# Patient Record
Sex: Male | Born: 1970
Health system: Southern US, Community
[De-identification: ages and names within clinical notes are randomized; demographics above are authoritative.]

## PROBLEM LIST (undated history)

## (undated) DIAGNOSIS — K5090 Crohn's disease, unspecified, without complications: Secondary | ICD-10-CM

## (undated) HISTORY — PX: ABDOMINAL SURGERY: SHX537

## (undated) HISTORY — DX: Crohn's disease, unspecified, without complications: K50.90

---

## 2000-08-14 ENCOUNTER — Emergency Department (HOSPITAL_COMMUNITY): Admission: EM | Admit: 2000-08-14 | Discharge: 2000-08-14 | Payer: Self-pay | Admitting: Emergency Medicine

## 2001-04-08 ENCOUNTER — Emergency Department (HOSPITAL_COMMUNITY): Admission: EM | Admit: 2001-04-08 | Discharge: 2001-04-08 | Payer: Self-pay | Admitting: Emergency Medicine

## 2001-07-28 ENCOUNTER — Emergency Department (HOSPITAL_COMMUNITY): Admission: EM | Admit: 2001-07-28 | Discharge: 2001-07-29 | Payer: Self-pay | Admitting: *Deleted

## 2001-09-02 ENCOUNTER — Encounter: Payer: Self-pay | Admitting: General Surgery

## 2001-09-03 ENCOUNTER — Inpatient Hospital Stay (HOSPITAL_COMMUNITY): Admission: RE | Admit: 2001-09-03 | Discharge: 2001-09-09 | Payer: Self-pay | Admitting: General Surgery

## 2001-09-03 ENCOUNTER — Encounter (INDEPENDENT_AMBULATORY_CARE_PROVIDER_SITE_OTHER): Payer: Self-pay | Admitting: *Deleted

## 2001-09-12 ENCOUNTER — Emergency Department (HOSPITAL_COMMUNITY): Admission: EM | Admit: 2001-09-12 | Discharge: 2001-09-12 | Payer: Self-pay

## 2002-02-18 ENCOUNTER — Ambulatory Visit (HOSPITAL_COMMUNITY): Admission: RE | Admit: 2002-02-18 | Discharge: 2002-02-18 | Payer: Self-pay | Admitting: Gastroenterology

## 2002-02-18 ENCOUNTER — Encounter (INDEPENDENT_AMBULATORY_CARE_PROVIDER_SITE_OTHER): Payer: Self-pay | Admitting: *Deleted

## 2002-02-18 ENCOUNTER — Encounter: Payer: Self-pay | Admitting: Gastroenterology

## 2004-12-01 ENCOUNTER — Emergency Department (HOSPITAL_COMMUNITY): Admission: EM | Admit: 2004-12-01 | Discharge: 2004-12-02 | Payer: Self-pay | Admitting: Emergency Medicine

## 2005-03-16 ENCOUNTER — Emergency Department (HOSPITAL_COMMUNITY): Admission: EM | Admit: 2005-03-16 | Discharge: 2005-03-16 | Payer: Self-pay | Admitting: Emergency Medicine

## 2005-04-04 ENCOUNTER — Ambulatory Visit: Payer: Self-pay | Admitting: Family Medicine

## 2005-05-02 ENCOUNTER — Encounter (INDEPENDENT_AMBULATORY_CARE_PROVIDER_SITE_OTHER): Payer: Self-pay | Admitting: Specialist

## 2005-05-02 ENCOUNTER — Emergency Department (HOSPITAL_COMMUNITY): Admission: EM | Admit: 2005-05-02 | Discharge: 2005-05-02 | Payer: Self-pay | Admitting: Emergency Medicine

## 2005-06-13 ENCOUNTER — Ambulatory Visit: Payer: Self-pay | Admitting: Gastroenterology

## 2005-06-29 ENCOUNTER — Emergency Department (HOSPITAL_COMMUNITY): Admission: EM | Admit: 2005-06-29 | Discharge: 2005-06-29 | Payer: Self-pay | Admitting: Emergency Medicine

## 2005-08-15 ENCOUNTER — Encounter (INDEPENDENT_AMBULATORY_CARE_PROVIDER_SITE_OTHER): Payer: Self-pay | Admitting: *Deleted

## 2005-08-15 ENCOUNTER — Ambulatory Visit: Payer: Self-pay | Admitting: Gastroenterology

## 2005-08-15 DIAGNOSIS — K5289 Other specified noninfective gastroenteritis and colitis: Secondary | ICD-10-CM

## 2005-08-29 ENCOUNTER — Ambulatory Visit: Payer: Self-pay | Admitting: Gastroenterology

## 2005-09-01 ENCOUNTER — Ambulatory Visit: Payer: Self-pay | Admitting: Gastroenterology

## 2005-09-01 ENCOUNTER — Ambulatory Visit (HOSPITAL_COMMUNITY): Admission: RE | Admit: 2005-09-01 | Discharge: 2005-09-01 | Payer: Self-pay | Admitting: Gastroenterology

## 2005-09-10 ENCOUNTER — Ambulatory Visit: Payer: Self-pay | Admitting: Gastroenterology

## 2005-10-13 ENCOUNTER — Emergency Department (HOSPITAL_COMMUNITY): Admission: EM | Admit: 2005-10-13 | Discharge: 2005-10-13 | Payer: Self-pay | Admitting: *Deleted

## 2006-09-07 ENCOUNTER — Emergency Department (HOSPITAL_COMMUNITY): Admission: EM | Admit: 2006-09-07 | Discharge: 2006-09-07 | Payer: Self-pay | Admitting: Emergency Medicine

## 2007-09-08 ENCOUNTER — Ambulatory Visit: Payer: Self-pay | Admitting: Gastroenterology

## 2007-09-08 LAB — CONVERTED CEMR LAB
ALT: 9 units/L (ref 0–53)
Alkaline Phosphatase: 78 units/L (ref 39–117)
BUN: 8 mg/dL (ref 6–23)
Basophils Relative: 1.3 % — ABNORMAL HIGH (ref 0.0–1.0)
CO2: 30 meq/L (ref 19–32)
Calcium: 9.7 mg/dL (ref 8.4–10.5)
Creatinine, Ser: 1 mg/dL (ref 0.4–1.5)
Eosinophils Relative: 4.5 % (ref 0.0–5.0)
Glucose, Bld: 85 mg/dL (ref 70–99)
Hemoglobin: 13.6 g/dL (ref 13.0–17.0)
Monocytes Absolute: 0.6 10*3/uL (ref 0.2–0.7)
Monocytes Relative: 12.4 % — ABNORMAL HIGH (ref 3.0–11.0)
Platelets: 394 10*3/uL (ref 150–400)
RDW: 13.6 % (ref 11.5–14.6)
Total Bilirubin: 0.8 mg/dL (ref 0.3–1.2)
Total Protein: 6.1 g/dL (ref 6.0–8.3)
WBC: 4.5 10*3/uL (ref 4.5–10.5)

## 2008-01-07 ENCOUNTER — Ambulatory Visit: Payer: Self-pay | Admitting: Gastroenterology

## 2008-07-14 ENCOUNTER — Telehealth: Payer: Self-pay | Admitting: Gastroenterology

## 2008-12-23 ENCOUNTER — Emergency Department (HOSPITAL_COMMUNITY): Admission: EM | Admit: 2008-12-23 | Discharge: 2008-12-23 | Payer: Self-pay | Admitting: Emergency Medicine

## 2008-12-23 ENCOUNTER — Encounter: Payer: Self-pay | Admitting: Gastroenterology

## 2008-12-23 ENCOUNTER — Encounter (INDEPENDENT_AMBULATORY_CARE_PROVIDER_SITE_OTHER): Payer: Self-pay | Admitting: *Deleted

## 2008-12-23 ENCOUNTER — Ambulatory Visit: Payer: Self-pay | Admitting: Internal Medicine

## 2009-05-07 ENCOUNTER — Encounter: Payer: Self-pay | Admitting: Gastroenterology

## 2009-05-11 ENCOUNTER — Ambulatory Visit: Payer: Self-pay | Admitting: Gastroenterology

## 2009-06-13 ENCOUNTER — Telehealth: Payer: Self-pay | Admitting: Gastroenterology

## 2009-06-15 ENCOUNTER — Ambulatory Visit: Payer: Self-pay | Admitting: Gastroenterology

## 2009-07-10 ENCOUNTER — Ambulatory Visit: Payer: Self-pay | Admitting: Family Medicine

## 2009-07-11 ENCOUNTER — Inpatient Hospital Stay (HOSPITAL_COMMUNITY): Admission: EM | Admit: 2009-07-11 | Discharge: 2009-07-15 | Payer: Self-pay | Admitting: Emergency Medicine

## 2009-07-13 ENCOUNTER — Telehealth (INDEPENDENT_AMBULATORY_CARE_PROVIDER_SITE_OTHER): Payer: Self-pay | Admitting: *Deleted

## 2009-07-23 ENCOUNTER — Inpatient Hospital Stay (HOSPITAL_COMMUNITY): Admission: EM | Admit: 2009-07-23 | Discharge: 2009-07-26 | Payer: Self-pay | Admitting: Emergency Medicine

## 2009-08-14 ENCOUNTER — Telehealth (INDEPENDENT_AMBULATORY_CARE_PROVIDER_SITE_OTHER): Payer: Self-pay | Admitting: *Deleted

## 2010-06-20 ENCOUNTER — Encounter: Payer: Self-pay | Admitting: Gastroenterology

## 2010-08-25 ENCOUNTER — Encounter: Payer: Self-pay | Admitting: Internal Medicine

## 2010-09-03 NOTE — Progress Notes (Signed)
Summary: Record request from [facility]  Request for records received from [facility] c/o Dr. Charlott Rakes. Request forwarded to [facility]. Reginald Johnson  August 14, 2009 10:48 AM  Appended Document: Record request from [facility] Request for records received from  [facility] c/o Dr. Charlott Rakes.Request forwarded to [employer].

## 2010-09-11 NOTE — Letter (Signed)
Summary: USPS Domestic Rerturn Receipt  USPS Domestic Rerturn Receipt   Imported By: Lenard Forth 09/03/2010 16:23:53  _____________________________________________________________________  External Attachment:    Type:   Image     Comment:   External Document

## 2010-11-04 LAB — BASIC METABOLIC PANEL
BUN: 4 mg/dL — ABNORMAL LOW (ref 6–23)
Creatinine, Ser: 0.61 mg/dL (ref 0.4–1.5)
GFR calc non Af Amer: >60 mL/min (ref >60)
Potassium: 4.1 mEq/L (ref 3.5–5.1)

## 2010-11-04 LAB — CBC
Hemoglobin: 11.5 g/dL — ABNORMAL LOW (ref 13.0–17.0)
Platelets: 484 10*3/uL — ABNORMAL HIGH (ref 150–400)
RBC: 4.68 MIL/uL (ref 4.22–5.81)
RDW: 15.3 % (ref 11.5–15.5)
RDW: 15.4 % (ref 11.5–15.5)
WBC: 11.1 10*3/uL — ABNORMAL HIGH (ref 4.0–10.5)

## 2010-11-04 LAB — DIFFERENTIAL
Basophils Relative: 1 % (ref 0–1)
Eosinophils Absolute: 0.1 10*3/uL (ref 0.0–0.7)
Monocytes Relative: 11 % (ref 3–12)
Neutrophils Relative %: 75 % (ref 43–77)

## 2010-11-04 LAB — COMPREHENSIVE METABOLIC PANEL
Albumin: 2.7 g/dL — ABNORMAL LOW (ref 3.5–5.2)
Alkaline Phosphatase: 54 U/L (ref 39–117)
BUN: 4 mg/dL — ABNORMAL LOW (ref 6–23)
CO2: 27 mEq/L (ref 19–32)
Chloride: 104 mEq/L (ref 96–112)
Glucose, Bld: 99 mg/dL (ref 70–99)
Potassium: 3.7 mEq/L (ref 3.5–5.1)
Total Bilirubin: 0.7 mg/dL (ref 0.3–1.2)

## 2010-11-04 LAB — URINALYSIS, ROUTINE W REFLEX MICROSCOPIC
Bilirubin Urine: NEGATIVE
Glucose, UA: NEGATIVE mg/dL
Hgb urine dipstick: NEGATIVE
Ketones, ur: NEGATIVE mg/dL
Protein, ur: NEGATIVE mg/dL

## 2010-11-04 LAB — URINE CULTURE: Culture: NO GROWTH

## 2010-11-05 LAB — DIFFERENTIAL
Basophils Absolute: 0 10*3/uL (ref 0.0–0.1)
Basophils Absolute: 0 10*3/uL (ref 0.0–0.1)
Basophils Relative: 0 % (ref 0–1)
Basophils Relative: 1 % (ref 0–1)
Eosinophils Absolute: 0 10*3/uL (ref 0.0–0.7)
Eosinophils Absolute: 0 10*3/uL (ref 0.0–0.7)
Eosinophils Absolute: 0.1 10*3/uL (ref 0.0–0.7)
Eosinophils Relative: 0 % (ref 0–5)
Eosinophils Relative: 0 % (ref 0–5)
Eosinophils Relative: 1 % (ref 0–5)
Eosinophils Relative: 1 % (ref 0–5)
Lymphocytes Relative: 12 % (ref 12–46)
Lymphs Abs: 1 10*3/uL (ref 0.7–4.0)
Lymphs Abs: 1 10*3/uL (ref 0.7–4.0)
Lymphs Abs: 1.4 10*3/uL (ref 0.7–4.0)
Monocytes Absolute: 0.1 10*3/uL (ref 0.1–1.0)
Monocytes Absolute: 0.5 10*3/uL (ref 0.1–1.0)
Monocytes Relative: 2 % — ABNORMAL LOW (ref 3–12)
Monocytes Relative: 9 % (ref 3–12)
Neutro Abs: 5.6 10*3/uL (ref 1.7–7.7)
Neutrophils Relative %: 61 % (ref 43–77)

## 2010-11-05 LAB — COMPREHENSIVE METABOLIC PANEL
ALT: <8 U/L (ref 0–53)
ALT: <8 U/L (ref 0–53)
AST: 6 U/L (ref 0–37)
AST: 8 U/L (ref 0–37)
AST: 8 U/L (ref 0–37)
Albumin: 2.2 g/dL — ABNORMAL LOW (ref 3.5–5.2)
Alkaline Phosphatase: 58 U/L (ref 39–117)
Alkaline Phosphatase: 58 U/L (ref 39–117)
Alkaline Phosphatase: 66 U/L (ref 39–117)
CO2: 26 mEq/L (ref 19–32)
CO2: 27 mEq/L (ref 19–32)
Calcium: 8.2 mg/dL — ABNORMAL LOW (ref 8.4–10.5)
Chloride: 103 mEq/L (ref 96–112)
Chloride: 107 mEq/L (ref 96–112)
GFR calc Af Amer: >60 mL/min (ref >60)
GFR calc non Af Amer: >60 mL/min (ref >60)
GFR calc non Af Amer: >60 mL/min (ref >60)
Glucose, Bld: 112 mg/dL — ABNORMAL HIGH (ref 70–99)
Glucose, Bld: 89 mg/dL (ref 70–99)
Potassium: 3.6 mEq/L (ref 3.5–5.1)
Potassium: 4.3 mEq/L (ref 3.5–5.1)
Sodium: 137 mEq/L (ref 135–145)
Sodium: 138 mEq/L (ref 135–145)
Sodium: 138 mEq/L (ref 135–145)
Total Bilirubin: 0.2 mg/dL — ABNORMAL LOW (ref 0.3–1.2)
Total Bilirubin: 0.6 mg/dL (ref 0.3–1.2)
Total Protein: 5.5 g/dL — ABNORMAL LOW (ref 6.0–8.3)
Total Protein: 6.3 g/dL (ref 6.0–8.3)

## 2010-11-05 LAB — CBC
HCT: 31.4 % — ABNORMAL LOW (ref 39.0–52.0)
Hemoglobin: 10.4 g/dL — ABNORMAL LOW (ref 13.0–17.0)
MCHC: 31.5 g/dL (ref 30.0–36.0)
MCV: 77.5 fL — ABNORMAL LOW (ref 78.0–100.0)
Platelets: 465 10*3/uL — ABNORMAL HIGH (ref 150–400)
RBC: 4.05 MIL/uL — ABNORMAL LOW (ref 4.22–5.81)
RBC: 4.21 MIL/uL — ABNORMAL LOW (ref 4.22–5.81)
RBC: 4.64 MIL/uL (ref 4.22–5.81)
RDW: 14 % (ref 11.5–15.5)
WBC: 6.3 10*3/uL (ref 4.0–10.5)
WBC: 6.5 10*3/uL (ref 4.0–10.5)
WBC: 7.5 10*3/uL (ref 4.0–10.5)
WBC: 8.4 10*3/uL (ref 4.0–10.5)

## 2010-11-05 LAB — URINALYSIS, ROUTINE W REFLEX MICROSCOPIC
Nitrite: NEGATIVE
Specific Gravity, Urine: 1.043 — ABNORMAL HIGH (ref 1.005–1.030)
Urobilinogen, UA: 0.2 mg/dL (ref 0.0–1.0)

## 2010-11-05 LAB — MAGNESIUM: Magnesium: 2 mg/dL (ref 1.5–2.5)

## 2010-11-05 LAB — IRON AND TIBC: Saturation Ratios: 10 % — ABNORMAL LOW (ref 20–55)

## 2010-11-05 LAB — LIPASE, BLOOD: Lipase: 11 U/L (ref 11–59)

## 2010-11-05 LAB — BASIC METABOLIC PANEL
Chloride: 104 mEq/L (ref 96–112)
GFR calc Af Amer: >60 mL/min (ref >60)
Potassium: 3.6 mEq/L (ref 3.5–5.1)
Sodium: 136 mEq/L (ref 135–145)

## 2010-11-05 LAB — PROTIME-INR: INR: 1.24 (ref 0.00–1.49)

## 2010-11-05 LAB — RETICULOCYTES: Retic Count, Absolute: 25.7 10*3/uL (ref 19.0–186.0)

## 2010-11-12 LAB — LIPASE, BLOOD: Lipase: 14 U/L (ref 11–59)

## 2010-11-12 LAB — DIFFERENTIAL
Lymphocytes Relative: 8 % — ABNORMAL LOW (ref 12–46)
Lymphs Abs: 0.8 10*3/uL (ref 0.7–4.0)
Monocytes Relative: 1 % — ABNORMAL LOW (ref 3–12)
Neutro Abs: 8.9 10*3/uL — ABNORMAL HIGH (ref 1.7–7.7)
Neutrophils Relative %: 90 % — ABNORMAL HIGH (ref 43–77)

## 2010-11-12 LAB — COMPREHENSIVE METABOLIC PANEL
CO2: 26 mEq/L (ref 19–32)
Calcium: 9.6 mg/dL (ref 8.4–10.5)
Creatinine, Ser: 0.92 mg/dL (ref 0.4–1.5)
GFR calc non Af Amer: >60 mL/min (ref >60)
Glucose, Bld: 108 mg/dL — ABNORMAL HIGH (ref 70–99)
Sodium: 139 mEq/L (ref 135–145)
Total Protein: 7.4 g/dL (ref 6.0–8.3)

## 2010-11-12 LAB — CBC
Hemoglobin: 15.4 g/dL (ref 13.0–17.0)
MCHC: 32.8 g/dL (ref 30.0–36.0)
MCV: 79.9 fL (ref 78.0–100.0)
RBC: 5.88 MIL/uL — ABNORMAL HIGH (ref 4.22–5.81)
RDW: 14.7 % (ref 11.5–15.5)

## 2010-11-19 ENCOUNTER — Emergency Department (HOSPITAL_COMMUNITY)
Admission: EM | Admit: 2010-11-19 | Discharge: 2010-11-19 | Disposition: A | Discharge status: Home / Self Care | Payer: Commercial Indemnity | Attending: Emergency Medicine | Admitting: Emergency Medicine

## 2010-11-19 ENCOUNTER — Emergency Department (HOSPITAL_COMMUNITY): Payer: Commercial Indemnity

## 2010-11-19 DIAGNOSIS — R0602 Shortness of breath: Secondary | ICD-10-CM | POA: Insufficient documentation

## 2010-11-19 DIAGNOSIS — R0609 Other forms of dyspnea: Secondary | ICD-10-CM | POA: Insufficient documentation

## 2010-11-19 DIAGNOSIS — R079 Chest pain, unspecified: Secondary | ICD-10-CM | POA: Insufficient documentation

## 2010-11-19 DIAGNOSIS — K219 Gastro-esophageal reflux disease without esophagitis: Secondary | ICD-10-CM | POA: Insufficient documentation

## 2010-11-19 DIAGNOSIS — K5090 Crohn's disease, unspecified, without complications: Secondary | ICD-10-CM | POA: Insufficient documentation

## 2010-11-19 DIAGNOSIS — Z79899 Other long term (current) drug therapy: Secondary | ICD-10-CM | POA: Insufficient documentation

## 2010-11-19 DIAGNOSIS — R0989 Other specified symptoms and signs involving the circulatory and respiratory systems: Secondary | ICD-10-CM | POA: Insufficient documentation

## 2010-11-19 DIAGNOSIS — E876 Hypokalemia: Secondary | ICD-10-CM | POA: Insufficient documentation

## 2010-11-19 LAB — CBC
MCV: 79.5 fL (ref 78.0–100.0)
Platelets: 318 10*3/uL (ref 150–400)
RDW: 14.6 % (ref 11.5–15.5)
WBC: 5.8 10*3/uL (ref 4.0–10.5)

## 2010-11-19 LAB — COMPREHENSIVE METABOLIC PANEL
AST: 15 U/L (ref 0–37)
Albumin: 3.6 g/dL (ref 3.5–5.2)
Alkaline Phosphatase: 80 U/L (ref 39–117)
BUN: 6 mg/dL (ref 6–23)
GFR calc Af Amer: >60 mL/min (ref >60)
Potassium: 2.7 mEq/L — CL (ref 3.5–5.1)
Total Protein: 6.4 g/dL (ref 6.0–8.3)

## 2010-11-19 LAB — DIFFERENTIAL
Basophils Absolute: 0.1 10*3/uL (ref 0.0–0.1)
Basophils Relative: 1 % (ref 0–1)
Eosinophils Absolute: 0.1 10*3/uL (ref 0.0–0.7)
Eosinophils Relative: 1 % (ref 0–5)
Lymphs Abs: 2.5 10*3/uL (ref 0.7–4.0)
Neutrophils Relative %: 42 % — ABNORMAL LOW (ref 43–77)

## 2010-11-19 LAB — POCT CARDIAC MARKERS
CKMB, poc: <1 ng/mL — ABNORMAL LOW (ref 1.0–8.0)
CKMB, poc: <1 ng/mL — ABNORMAL LOW (ref 1.0–8.0)
Myoglobin, poc: 61 ng/mL (ref 12–200)
Troponin i, poc: <0.05 ng/mL (ref 0.00–0.09)
Troponin i, poc: <0.05 ng/mL (ref 0.00–0.09)

## 2010-12-07 ENCOUNTER — Encounter: Payer: Self-pay | Admitting: Family Medicine

## 2010-12-17 NOTE — Assessment & Plan Note (Signed)
[facility]                         GASTROENTEROLOGY OFFICE NOTE   OLIS, VIVERETTE                         MRN:          [id number]  DATE:09/08/2007                            DOB:          18-Jul-1971    PROBLEM:  Crohn's disease.   Mr. Mengel has returned for scheduled followup.  He is doing well from a  GI standpoint.  He has no specific complaints.  He has been taking  Imuran, Flagyl and Asacol, though not reliably.  His wife states he has  lost about ten pounds, due to poor appetite.  He states his appetite is  decreased, though he feels well.   PHYSICAL EXAMINATION:  Pulse 72, blood pressure 112/68, weight 185.   HEENT: EOMI.  PERRLA.  Sclerae are anicteric.  Conjunctivae are pink.  NECK:  Supple without thyromegaly, adenopathy or carotid bruits.  CHEST:  Clear to auscultation and percussion without adventitious  sounds.  CARDIAC:  Regular rhythm; normal S1 S2.  There are no murmurs, gallops  or rubs.  ABDOMEN:  Bowel sounds are normoactive.  Abdomen is soft, nontender and  nondistended.  There are no abdominal masses, tenderness, splenic  enlargement or hepatomegaly.  EXTREMITIES:  Full range of motion.  No cyanosis, clubbing or edema.  RECTAL:  Deferred.   IMPRESSION:  1. Crohn's disease involving the ileum and cecum - status post      resection.  In remission.  2. Perirectal disease.  At present, there is no evidence for active      disease.  3. Weight-loss.  The etiology for this is not clear.   RECOMMENDATION:  1. Resume medications.  I tried to emphasize, in particular, for him      to take the Imuran.  2. Check CBC and CMET.  3. Chantix for smoking cessation.     Barbette Hair. Arlyce Dice, MD,FACG  Electronically Signed    RDK/MedQ  DD: 09/08/2007  DT: 09/08/2007  Job #: [id number]

## 2010-12-20 NOTE — Op Note (Signed)
[place]  Patient:    Reginald Johnson, Reginald Johnson Visit Number: [id number] MRN: [id number]          Service Type: SUR Location: [id number] Attending Physician:  Arlis Porta Dictated by:   Adolph Pollack, M.D. Proc. Date: 09/03/01 Admit Date:  09/03/2001   CC:         Delano Metz, M.D., [facility] [address]   Operative Report  PREOPERATIVE DIAGNOSIS:  Abdominal mass.  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  PROCEDURE:  Exploratory laparotomy, small bowel resection, ileocecectomy.  SURGEON:  Adolph Pollack, M.D.  ASSISTANT:  Ollen Gross. Vernell Morgans, M.D.  ANESTHESIA:  General.  FINDINGS:  There was an enteroenteric fistula from the proximal to the distal ileum.  There was inflammatory bowel and mesentery of approximately 25 cm of distal ileum with creeping fat.  There is partial obstruction at this point.  INDICATIONS:  This is a 40 year old male who was seen in my office two days ago.  He was initially seen by Dr. Salvadore Farber and has had three months of progressively increasing abdominal pain with some partial obstructive symptoms.  A CT scan demonstrated what appeared to be inflammatory intra-abdominal mass.  He presents for urgent exploratory laparotomy.  DESCRIPTION OF PROCEDURE:  He was placed supine on the operating table and general anesthetic was administered.  A Foley catheter was placed in the bladder.  His abdomen was shaved and sterilely prepped and draped.  A midline incision was made, incising the skin sharply.  The subcutaneous tissue, fascia, and peritoneum were divided with the cautery.  Once entering the intra-abdominal cavity, a large inflammatory mass was noted just to the right of the umbilicus.  Omentum was adherent to it.  I noted that this inflammatory mass involved most of the distal ileum and its mesentery.  There was also a proximal  segment of ileum that was firmly adherent to the most inflamed portion of this mass.  I initially divided some of the omental attachments and ligated them.  I then tried to dissect the proximal segment of the small bowel that was adherent to the rest of the distal ileum, but it was intimately attached, and I suspected it was a fistula.  I subsequently performed a small bowel resection at the proximal and distal aspects of this area of small bowel adherent to the distal ileum.  I left this connected to the distal ileal portion, and performed a side-to-side stapled anastomosis which was patent, viable, and under no tension.  Next, I divided the ileum proximal to its most inflamed point.  There was creeping fat involving most of if not all of the distal ileum with rubbery-type bowel.  Some of the intestine proximal to the most inflamed point was dilated consistent with partial obstruction.  I then mobilized the right colon, and divided the ascending colon just proximal to the cecum.  I then divided the thickened mesentery between clamps, and suture ligated, as well as tie ligated vessels.  The specimen was then passed off the field.  The specimen included the cecum, 25 cm distal ileum, and approximately 5 cm of proximal ileum that was adherent to the most inflamed portion of the distal ileum.  Next, I irrigated out the wound.  I then performed a side-to-side anastomosis between the mid ileum, and the ascending colon that was stapled.  The anastomosis was patent, viable, and under no tension.  I oversewed  the enterotomy stapled closure line with interrupted 3-0 Vicryl sutures.  I noticed that part of the tip of the ascending colon appeared to be somewhat ischemic, and I invaginated this with interrupted sutures.  Next, the gloves were changed, and the abdominal cavity was irrigated.  We ran the entire small bowel, looked at the colon, and noted no other evidence of inflammatory bowel  disease.  The gallbladder was without gallstones.  The liver was smooth.  The stomach was smooth.  The spleen was smooth without nodules.  All the sponges were removed and the counts were confirmed to be correct.  The midline fascia was then closed with a running #1 PDS suture.  The subcutaneous tissue was irrigated and the skin was closed with staples.  He tolerated the procedure well without any apparent complications and was taken to the recovery room in satisfactory condition. Dictated by:   Adolph Pollack, M.D. Attending Physician:  Arlis Porta DD:  09/03/01 TD:  09/03/01 Job: [id number]

## 2010-12-20 NOTE — Procedures (Signed)
. [name], Reginald Johnson Visit Number: [id number] MRN: [id number]          Service Type: END Location: ENDO Attending Physician:  Charna Elizabeth Dictated by:   Anselmo Rod, M.D. Proc. Date: 02/18/02 Admit Date:  02/18/2002 Discharge Date: 02/18/2002   CC:         Adolph Pollack, M.D.   Procedure Report  DATE OF BIRTH:  June 19, 1970  REFERRING PHYSICIAN:  Adolph Pollack, M.D.  PROCEDURE PERFORMED:  Colonoscopy with biopsies.  ENDOSCOPIST:  Anselmo Rod, M.D.  INSTRUMENT USED:  Olympus video colonoscope.  INDICATIONS FOR PROCEDURE:  History of Crohns disease in a 40 year old African-American male who in January of 2003 had a laparotomy with small bowel resection and ileocecectomy by Dr. Abbey Chatters.  A colonoscopy is being done to evaluate the extent of disease.  The patient is being maintained on Pentasa which he claims he is taking on a regular basis, two tablets three times a day.  PREPROCEDURE PHYSICAL:  The patient had stable vital signs.  Neck supple. Chest clear to auscultation.  S1, S2 regular.  Abdomen soft obese with a well-healed surgical scar from above-mentioned surgery, nontender with normal bowel sounds.  DESCRIPTION OF PROCEDURE:  The patient was placed in the left lateral decubitus position and sedated with 100 mg of Demerol and 10 mg of Versed intravenously.  Once the patient was adequately sedated and maintained on low-flow oxygen and continuous cardiac monitoring, the Olympus video colonoscope was advanced from the rectum to what appeared to be the anastomotic site after the ileocecectomy.  A large lobulated mass was seen at the anastomosis and the scope could not be advanced beyond this point despite several efforts to do so.  Multiple biopsies were done from this site.  No other abnormalities were noted throughout the colon.  No erosions, ulcerations or diverticular disease was  present.  Small internal hemorrhoids were seen on retroflexion.  IMPRESSION: 1. Large lobulated mass at the anastomosis of unclear origin, question    inflammatory mass. 2. Small internal hemorrhoids. 3. Normal-appearing colonic mucosa up to the anastomosis.  No erosions or    ulcerations seen.  RECOMMENDATIONS: 1. Await pathology results.  CT scan of the abdomen and pelvis today. 2. Check labs, CBC with diff, CMET, ESR and CEA level today. 3. Follow-up with Dr. Abbey Chatters within the next seven to 10 days. 4. Outpatient follow-up with me within the next seven days.  Findings on the colonoscopy were discussed with Dr. Abbey Chatters, who was in the operating room at the time the colonoscopy was performed and could not be at the patients bedside.  Dr. Abbey Chatters will see the patient in follow-up once all the above-mentioned test results have been procured. Dictated by:   Anselmo Rod, M.D. Attending Physician:  Charna Elizabeth DD:  02/18/02 TD:  02/23/02 Job: [id number]

## 2010-12-20 NOTE — Discharge Summary (Signed)
[place]  Patient:    Reginald Johnson, REIERSON Visit Number: [id number] MRN: [id number]          Service Type: SUR Location: 5700 [id number] Attending Physician:  Arlis Porta Dictated by:   Adolph Pollack, M.D. Admit Date:  09/03/2001 Discharge Date: 09/09/2001                             Discharge Summary  PRINCIPAL DISCHARGE DIAGNOSIS:  Crohns disease.  SECONDARY DIAGNOSES: 1. Antral fistula secondary to Crohns disease. 2. Partial small-bowel obstruction secondary to Crohns disease.  PROCEDURE:  Exploratory laparotomy, small-bowel resection, ileocecectomy September 03, 2001.  REASON FOR ADMISSION:  Mr. Kaluzny is a 40 year old male who has been having intermittent abdominal pains over a number of years that progressively worsened recently.  He was seen by Delano Metz, M.D., and a CT scan was ordered which demonstrated a large inflammatory mass.  This mass was palpable on examination.  After I saw him in the office, I scheduled him urgently for exploratory laparotomy.  HOSPITAL COURSE:  He underwent the above procedure.  Postoperatively, he had low grade fever on postop day #1 that quickly defervesced.  He was able to have his NG tube removed on his fourth postoperative day.  His wound was clean and intact.  He was started on a diet.  His pathology demonstrated findings consistent with Crohns disease with abscess in fistula.  This was explained to him and literature was given to him.  By the sixth postoperative day, he was tolerating oral diet and oral analgesics.  Wound was clean and intact. Bowels were moving well and he was ready to be discharged.  DISPOSITION:  Discharged to home in satisfactory condition on September 09, 2001.  DIET:  Unlimited.  ACTIVITIES:  Limited to no heavy lifting or straining.  No driving for one week.  Out of work until released by me.  DISCHARGE MEDICATIONS: 1. Pentasa 250 mg t.i.d. 2.  Tylox.  FOLLOW-UP:  His outpatient follow-up will be with myself in two weeks as well as with Anselmo Rod, M.D., for long-term gastroenterology follow-up.  DISCHARGE INSTRUCTIONS:  He was given a discharge instruction sheet. Dictated by:   Adolph Pollack, M.D. Attending Physician:  Arlis Porta DD:  09/09/01 TD:  09/10/01 Job: [id number]

## 2011-03-07 ENCOUNTER — Encounter: Payer: Commercial Indemnity | Admitting: Medical

## 2011-07-24 ENCOUNTER — Emergency Department (HOSPITAL_COMMUNITY): Payer: Commercial Indemnity

## 2011-07-24 ENCOUNTER — Emergency Department (HOSPITAL_COMMUNITY)
Admission: EM | Admit: 2011-07-24 | Discharge: 2011-07-24 | Disposition: A | Discharge status: Home / Self Care | Payer: Commercial Indemnity | Attending: Emergency Medicine | Admitting: Emergency Medicine

## 2011-07-24 ENCOUNTER — Encounter (HOSPITAL_COMMUNITY): Payer: Self-pay | Admitting: *Deleted

## 2011-07-24 ENCOUNTER — Other Ambulatory Visit: Payer: Self-pay

## 2011-07-24 DIAGNOSIS — R0602 Shortness of breath: Secondary | ICD-10-CM | POA: Insufficient documentation

## 2011-07-24 DIAGNOSIS — R064 Hyperventilation: Secondary | ICD-10-CM | POA: Insufficient documentation

## 2011-07-24 DIAGNOSIS — R209 Unspecified disturbances of skin sensation: Secondary | ICD-10-CM | POA: Insufficient documentation

## 2011-07-24 DIAGNOSIS — J3489 Other specified disorders of nose and nasal sinuses: Secondary | ICD-10-CM | POA: Insufficient documentation

## 2011-07-24 DIAGNOSIS — R059 Cough, unspecified: Secondary | ICD-10-CM | POA: Insufficient documentation

## 2011-07-24 DIAGNOSIS — R05 Cough: Secondary | ICD-10-CM | POA: Insufficient documentation

## 2011-07-24 DIAGNOSIS — K5090 Crohn's disease, unspecified, without complications: Secondary | ICD-10-CM | POA: Insufficient documentation

## 2011-07-24 DIAGNOSIS — R51 Headache: Secondary | ICD-10-CM | POA: Insufficient documentation

## 2011-07-24 DIAGNOSIS — E876 Hypokalemia: Secondary | ICD-10-CM | POA: Insufficient documentation

## 2011-07-24 DIAGNOSIS — R42 Dizziness and giddiness: Secondary | ICD-10-CM | POA: Insufficient documentation

## 2011-07-24 HISTORY — DX: Crohn's disease, unspecified, without complications: K50.90

## 2011-07-24 LAB — POCT I-STAT, CHEM 8
BUN: 3 mg/dL — ABNORMAL LOW (ref 6–23)
HCT: 43 % (ref 39.0–52.0)
Sodium: 142 mEq/L (ref 135–145)
TCO2: 26 mmol/L (ref 0–100)

## 2011-07-24 MED ORDER — POTASSIUM CHLORIDE CRYS ER 20 MEQ PO TBCR
40.0000 meq | EXTENDED_RELEASE_TABLET | Freq: Once | ORAL | Status: AC
  Administered 2011-07-24: 40 meq via ORAL
  Filled 2011-07-24: qty 2

## 2011-07-24 NOTE — Progress Notes (Signed)
40 year old male with a history of Crohn's disease has been having episodes of left arm numbness and circumoral numbness associated with a sense of lightheadedness. These have been happening since last night in the last as long as an hour. Nothing seems to make it better nothing seems to make it worse. He has noted some dry mouth associated with this. His exam is unremarkable. Clinically I suspect that these are episodes of hyperventilation. I've explained the physiology behind that and ways to treat himself if it recurs.

## 2011-07-24 NOTE — ED Notes (Signed)
To ed for eval intermittent numbness over the past 3 days. States he has had episodes of sob, left arm numbness, left leg numbness. No symptoms now. C/o HA last night.

## 2011-07-24 NOTE — ED Provider Notes (Signed)
History     CSN: [id number]  Arrival date & time 07/24/11  6213   First MD Initiated Contact with Patient 07/24/11 2031      Chief Complaint  Patient presents with  . Numbness     HPI  History provided by the patient and spouse. Patient is a 40 year old male with history of Crohn's who presents with complaints of intermittent left sided body numbness and tingling for the past 2 days. Symptoms came on gradually yesterday and were associated with dizziness, slight headache and shortness of breath. Dizziness was described as a combination of lightheadedness and feeling off balance and unsteady. Dizziness lasted approximately 2 hours. There was no associated nausea or vomiting. Patient reports having URI like symptoms for the past 3 weeks to have "lingered around". Patient initially had a cough and upper nasal congestions. These symptoms have lessened. Patient has no other significant past medical history.   Past Medical History  Diagnosis Date  . Crohn disease   . Crohn's     History reviewed. No pertinent past surgical history.  History reviewed. No pertinent family history.  History  Substance Use Topics  . Smoking status: Current Some Day Smoker  . Smokeless tobacco: Not on file  . Alcohol Use: No      Review of Systems  Constitutional: Negative for fever and chills.  Respiratory: Positive for shortness of breath. Negative for cough.   Gastrointestinal: Negative for nausea, vomiting, abdominal pain and diarrhea.  Neurological: Positive for dizziness, weakness, light-headedness, numbness and headaches. Negative for seizures and speech difficulty.  All other systems reviewed and are negative.    Allergies  Review of patient's allergies indicates no known allergies.  Home Medications   Current Outpatient Rx  Name Route Sig Dispense Refill  . ASPIRIN 81 MG PO TABS Oral Take 81 mg by mouth daily.      . AZATHIOPRINE 50 MG PO TABS Oral Take 50 mg by mouth daily.        Marland Kitchen VITAMIN B-12 PO Oral Take 1 tablet by mouth daily.      Marland Kitchen MESALAMINE 400 MG PO TBEC Oral Take 800 mg by mouth 3 (three) times daily.        BP 155/100  Pulse 110  Temp(Src) 98.8 F (37.1 C) (Oral)  Resp 18  SpO2 100%  Physical Exam  Nursing note and vitals reviewed. Constitutional: He is oriented to person, place, and time. He appears well-developed and well-nourished. No distress.  HENT:  Head: Normocephalic and atraumatic.  Right Ear: External ear normal.  Left Ear: External ear normal.  Mouth/Throat: Oropharynx is clear and moist.  Eyes: Conjunctivae and EOM are normal. Pupils are equal, round, and reactive to light.       No nystagmus  Neck: Normal range of motion. Neck supple. No tracheal deviation present.  Cardiovascular: Normal rate, regular rhythm and normal heart sounds.   Pulmonary/Chest: Effort normal and breath sounds normal. No stridor. No respiratory distress. He has no wheezes. He has no rales. He exhibits no tenderness.  Abdominal: Soft. Bowel sounds are normal. He exhibits no distension. There is no tenderness. There is no rebound and no guarding.  Musculoskeletal: Normal range of motion. He exhibits no edema and no tenderness.  Neurological: He is alert and oriented to person, place, and time. He has normal strength and normal reflexes. No cranial nerve deficit or sensory deficit. Coordination and gait normal.  Skin: Skin is warm. No rash noted.  Psychiatric: He has a normal  mood and affect. His behavior is normal.    ED Course  Procedures (including critical care time)   Labs Reviewed  I-STAT, CHEM 8   Results for orders placed during the hospital encounter of 07/24/11  POCT I-STAT, CHEM 8      Component Value Range   Sodium 142  135 - 145 (mEq/L)   Potassium 3.1 (*) 3.5 - 5.1 (mEq/L)   Chloride 105  96 - 112 (mEq/L)   BUN 3 (*) 6 - 23 (mg/dL)   Creatinine, Ser 1.61  0.50 - 1.35 (mg/dL)   Glucose, Bld 096 (*) 70 - 99 (mg/dL)   Calcium, Ion 0.45   1.12 - 1.32 (mmol/L)   TCO2 26  0 - 100 (mmol/L)   Hemoglobin 14.6  13.0 - 17.0 (g/dL)   HCT 40.9  81.1 - 91.4 (%)     Ct Head Wo Contrast  07/24/2011  *RADIOLOGY REPORT*  Clinical Data: Headache, dizziness, left arm tingling.  CT HEAD WITHOUT CONTRAST  Technique:  Contiguous axial images were obtained from the base of the skull through the vertex without contrast.  Comparison: None.  Findings: No acute intracranial abnormality.  Specifically, no hemorrhage, hydrocephalus, mass lesion, acute infarction, or significant intracranial injury.  No acute calvarial abnormality. Visualized paranasal sinuses and mastoids clear.  Orbital soft tissues unremarkable.  IMPRESSION: Normal study.  Original Report Authenticated By: Cyndie Chime, M.D.     1. Hypokalemia   2. Hyperventilating       MDM  8:30 PM patient seen and evaluated. Patient in no acute distress. Patient currently asymptomatic.   Pt also seen and discussed with attending physician.  Pt also reports some increased breathing/possible hyperventilation which may have caused his symptoms, especially symptoms of circumferential oral numbness.     Angus Seller, [place] 07/26/11 [phone]

## 2011-07-24 NOTE — ED Notes (Signed)
Pt's wife states pt is forgetting short term.

## 2011-07-27 NOTE — ED Provider Notes (Signed)
Medical screening examination/treatment/procedure(s) were conducted as a shared visit with non-physician practitioner(s) and myself.  I personally evaluated the patient during the encounter See separate progress note.  Dione Booze, MD 07/27/11 [phone]

## 2013-01-08 ENCOUNTER — Encounter (HOSPITAL_COMMUNITY): Payer: Self-pay | Admitting: Emergency Medicine

## 2013-01-08 ENCOUNTER — Observation Stay (HOSPITAL_COMMUNITY)
Admission: EM | Admit: 2013-01-08 | Discharge: 2013-01-09 | Disposition: A | Discharge status: Home / Self Care | Payer: BC Managed Care – PPO | Attending: Internal Medicine | Admitting: Internal Medicine

## 2013-01-08 ENCOUNTER — Emergency Department (HOSPITAL_COMMUNITY): Payer: BC Managed Care – PPO

## 2013-01-08 DIAGNOSIS — F17200 Nicotine dependence, unspecified, uncomplicated: Secondary | ICD-10-CM

## 2013-01-08 DIAGNOSIS — R0602 Shortness of breath: Secondary | ICD-10-CM | POA: Insufficient documentation

## 2013-01-08 DIAGNOSIS — R079 Chest pain, unspecified: Principal | ICD-10-CM

## 2013-01-08 DIAGNOSIS — K5090 Crohn's disease, unspecified, without complications: Secondary | ICD-10-CM | POA: Insufficient documentation

## 2013-01-08 DIAGNOSIS — Z720 Tobacco use: Secondary | ICD-10-CM

## 2013-01-08 DIAGNOSIS — K5289 Other specified noninfective gastroenteritis and colitis: Secondary | ICD-10-CM

## 2013-01-08 LAB — BASIC METABOLIC PANEL
CO2: 23 mEq/L (ref 19–32)
Chloride: 108 mEq/L (ref 96–112)
Creatinine, Ser: 0.88 mg/dL (ref 0.50–1.35)
GFR calc Af Amer: >90 mL/min (ref >90)
Potassium: 3.4 mEq/L — ABNORMAL LOW (ref 3.5–5.1)
Sodium: 142 mEq/L (ref 135–145)

## 2013-01-08 LAB — CREATININE, SERUM
Creatinine, Ser: 0.98 mg/dL (ref 0.50–1.35)
GFR calc Af Amer: >90 mL/min (ref >90)

## 2013-01-08 LAB — CBC
MCH: 27.7 pg (ref 26.0–34.0)
MCV: 81.9 fL (ref 78.0–100.0)
Platelets: 299 10*3/uL (ref 150–400)
Platelets: 307 10*3/uL (ref 150–400)
RBC: 5.09 MIL/uL (ref 4.22–5.81)
RBC: 4.99 MIL/uL (ref 4.22–5.81)
RDW: 14.5 % (ref 11.5–15.5)
WBC: 7.1 10*3/uL (ref 4.0–10.5)
WBC: 5.4 10*3/uL (ref 4.0–10.5)

## 2013-01-08 LAB — TROPONIN I
Troponin I: <0.3 ng/mL (ref <0.30)
Troponin I: <0.3 ng/mL (ref <0.30)

## 2013-01-08 LAB — POCT I-STAT TROPONIN I: Troponin i, poc: 0.01 ng/mL (ref 0.00–0.08)

## 2013-01-08 MED ORDER — AZATHIOPRINE 50 MG PO TABS
50.0000 mg | ORAL_TABLET | Freq: Every day | ORAL | Status: DC
  Administered 2013-01-08 – 2013-01-09 (×2): 50 mg via ORAL
  Filled 2013-01-08 (×2): qty 1

## 2013-01-08 MED ORDER — ACETAMINOPHEN 325 MG PO TABS
650.0000 mg | ORAL_TABLET | Freq: Four times a day (QID) | ORAL | Status: DC | PRN
  Administered 2013-01-08 (×2): 650 mg via ORAL
  Filled 2013-01-08 (×2): qty 2

## 2013-01-08 MED ORDER — MECLIZINE HCL 25 MG PO TABS
25.0000 mg | ORAL_TABLET | Freq: Once | ORAL | Status: AC
  Administered 2013-01-08: 25 mg via ORAL
  Filled 2013-01-08: qty 1

## 2013-01-08 MED ORDER — ONDANSETRON HCL 4 MG/2ML IJ SOLN
4.0000 mg | Freq: Once | INTRAMUSCULAR | Status: AC
  Administered 2013-01-08: 4 mg via INTRAVENOUS
  Filled 2013-01-08: qty 2

## 2013-01-08 MED ORDER — SODIUM CHLORIDE 0.9 % IV SOLN
INTRAVENOUS | Status: AC
  Administered 2013-01-08: 20:00:00 via INTRAVENOUS

## 2013-01-08 MED ORDER — ONDANSETRON HCL 4 MG/2ML IJ SOLN: 4.0000 mg | Freq: Four times a day (QID) | INTRAMUSCULAR | Status: DC | PRN

## 2013-01-08 MED ORDER — MESALAMINE 400 MG PO TBEC: 800.0000 mg | DELAYED_RELEASE_TABLET | Freq: Three times a day (TID) | ORAL | Status: DC

## 2013-01-08 MED ORDER — ASPIRIN EC 325 MG PO TBEC
325.0000 mg | DELAYED_RELEASE_TABLET | Freq: Every day | ORAL | Status: DC
  Administered 2013-01-08 – 2013-01-09 (×2): 325 mg via ORAL
  Filled 2013-01-08 (×2): qty 1

## 2013-01-08 MED ORDER — NITROGLYCERIN 0.4 MG SL SUBL
0.4000 mg | SUBLINGUAL_TABLET | SUBLINGUAL | Status: DC | PRN
  Administered 2013-01-08: 0.4 mg via SUBLINGUAL

## 2013-01-08 MED ORDER — ENOXAPARIN SODIUM 40 MG/0.4ML ~~LOC~~ SOLN
40.0000 mg | SUBCUTANEOUS | Status: DC
  Administered 2013-01-08: 40 mg via SUBCUTANEOUS
  Filled 2013-01-08 (×2): qty 0.4

## 2013-01-08 MED ORDER — MORPHINE SULFATE 4 MG/ML IJ SOLN
4.0000 mg | Freq: Once | INTRAMUSCULAR | Status: AC
  Administered 2013-01-08: 4 mg via INTRAVENOUS
  Filled 2013-01-08: qty 1

## 2013-01-08 MED ORDER — MESALAMINE 400 MG PO CPDR
800.0000 mg | DELAYED_RELEASE_CAPSULE | Freq: Three times a day (TID) | ORAL | Status: DC
  Administered 2013-01-08 – 2013-01-09 (×2): 800 mg via ORAL
  Filled 2013-01-08 (×4): qty 2

## 2013-01-08 MED ORDER — NITROGLYCERIN 0.4 MG SL SUBL: 0.4000 mg | SUBLINGUAL_TABLET | SUBLINGUAL | Status: DC | PRN

## 2013-01-08 MED ORDER — POTASSIUM CHLORIDE CRYS ER 20 MEQ PO TBCR
20.0000 meq | EXTENDED_RELEASE_TABLET | Freq: Once | ORAL | Status: AC
  Administered 2013-01-08: 20 meq via ORAL
  Filled 2013-01-08: qty 1

## 2013-01-08 MED ORDER — VITAMIN B-12 1000 MCG PO TABS
1000.0000 ug | ORAL_TABLET | Freq: Every day | ORAL | Status: DC
  Administered 2013-01-08 – 2013-01-09 (×2): 1000 ug via ORAL
  Filled 2013-01-08 (×2): qty 1

## 2013-01-08 MED ORDER — ACETAMINOPHEN 650 MG RE SUPP: 650.0000 mg | Freq: Four times a day (QID) | RECTAL | Status: DC | PRN

## 2013-01-08 MED ORDER — SODIUM CHLORIDE 0.9 % IJ SOLN
3.0000 mL | Freq: Two times a day (BID) | INTRAMUSCULAR | Status: DC
  Administered 2013-01-09: 3 mL via INTRAVENOUS

## 2013-01-08 MED ORDER — ONDANSETRON HCL 4 MG PO TABS: 4.0000 mg | ORAL_TABLET | Freq: Four times a day (QID) | ORAL | Status: DC | PRN

## 2013-01-08 NOTE — ED Notes (Signed)
Admitting at bedside 

## 2013-01-08 NOTE — ED Provider Notes (Signed)
History     CSN: [id number]  Arrival date & time 01/08/13  1191   First MD Initiated Contact with Patient 01/08/13 [phone]      Chief Complaint  Patient presents with  . Dizziness  . Chest Pain    (Consider location/radiation/quality/duration/timing/severity/associated sxs/prior treatment) HPI Comments: The patient was awakened about 1:30 with chest discomfort,pressure, nausea, and shortness of breath  He woke his wife saying he needed to take an aspirin.  They thought maybe some fresh air would help he walked outside for some fresh air.  This didn't help walk back into the bathroom.  He became weak and fell to the side.  Denies any injury from his fall.  He does have a history of Crohn's disease for which she takes Imuran and is a call.  Denies any blood in his stool.  States he has chronic diarrhea, which is unchanged.  He does report that he had a recent car trip about 3, and a half hours 2-3, weeks, ago, without any leg swelling.  No personal history of DVT or PE. Family history mother has a history of borderline hypertension.  That is not treated at this time.  Father is alive and well  Patient is a 42 y.o. male presenting with chest pain. The history is provided by the patient.  Chest Pain Pain location:  Substernal area Pain quality: pressure   Pain radiates to:  Does not radiate Pain radiates to the back: no   Pain severity:  Mild Onset quality:  Sudden Timing:  Constant Progression:  Unchanged Chronicity:  New Context: at rest   Relieved by:  Rest Ineffective treatments:  None tried Associated symptoms: abdominal pain, anxiety, diaphoresis, dizziness, nausea, shortness of breath and weakness   Associated symptoms: no back pain, no cough, no headache, no palpitations and not vomiting     Past Medical History  Diagnosis Date  . Crohn disease   . Crohn's     Past Surgical History  Procedure Laterality Date  . Abdominal surgery      No family history on  file.  History  Substance Use Topics  . Smoking status: Current Some Day Smoker  . Smokeless tobacco: Not on file  . Alcohol Use: No      Review of Systems  Constitutional: Positive for diaphoresis.  Respiratory: Positive for shortness of breath. Negative for cough.   Cardiovascular: Positive for chest pain. Negative for palpitations and leg swelling.  Gastrointestinal: Positive for nausea, abdominal pain and diarrhea. Negative for vomiting, constipation, blood in stool and rectal pain.  Genitourinary: Negative for dysuria.  Musculoskeletal: Negative for back pain.  Neurological: Positive for dizziness and weakness. Negative for headaches.  All other systems reviewed and are negative.    Allergies  Review of patient's allergies indicates no known allergies.  Home Medications   Current Outpatient Rx  Name  Route  Sig  Dispense  Refill  . aspirin 325 MG tablet   Oral   Take 325 mg by mouth once.         Marland Kitchen azaTHIOprine (IMURAN) 50 MG tablet   Oral   Take 50 mg by mouth daily.           . Cyanocobalamin (VITAMIN B-12 PO)   Oral   Take 1 tablet by mouth daily.           . mesalamine (ASACOL) 400 MG EC tablet   Oral   Take 800 mg by mouth 3 (three) times daily.  BP 122/90  Pulse 58  Temp(Src) 97.9 F (36.6 C) (Oral)  Resp 19  SpO2 100%  Physical Exam  Nursing note and vitals reviewed. Constitutional: He is oriented to person, place, and time. He appears well-developed and well-nourished.  HENT:  Head: Normocephalic.  Eyes: Pupils are equal, round, and reactive to light.  Neck: Normal range of motion.  Cardiovascular: Normal rate and regular rhythm.   Pulmonary/Chest: Effort normal and breath sounds normal.  Abdominal: Soft. Bowel sounds are normal. He exhibits no distension. There is no tenderness.  Genitourinary: Rectum normal. Rectal exam shows no external hemorrhoid, no internal hemorrhoid, no fissure, no mass, no tenderness and anal  tone normal.  Musculoskeletal: Normal range of motion. He exhibits no edema and no tenderness.  Lymphadenopathy:    He has no cervical adenopathy.  Neurological: He is alert and oriented to person, place, and time.  Skin: Skin is warm. No rash noted. No pallor.    ED Course  Procedures (including critical care time)  Labs Reviewed  BASIC METABOLIC PANEL - Abnormal; Notable for the following:    Potassium 3.4 (*)    All other components within normal limits  CBC  TROPONIN I  D-DIMER, QUANTITATIVE  POCT I-STAT TROPONIN I  OCCULT BLOOD, POC DEVICE   Dg Chest Port 1 View  01/08/2013   *RADIOLOGY REPORT*  Clinical Data: Central chest pain and shortness of breath; dizziness.  History of smoking.  PORTABLE CHEST - 1 VIEW  Comparison: Chest radiograph performed 11/19/2010  Findings: The lungs are well-aerated and clear.  There is no evidence of focal opacification, pleural effusion or pneumothorax.  The cardiomediastinal silhouette is within normal limits.  No acute osseous abnormalities are seen.  IMPRESSION: No acute cardiopulmonary process seen.   Original Report Authenticated By: Tonia Ghent, M.D.     1. Chest pain       MDM  Patient is feeling, better.  At this time.  His dizziness is improved after the use of meclizine.  He is no longer having chest pressure after nitroglycerin his pressure has improved, as well.  Discussed the findings with patient and his wife.  They agree to hospitalization for rule out cardiac disease. Spoke with Dr. Cranston Neighbor to admit patient         Arman Filter, NP 01/08/13 [phone]

## 2013-01-08 NOTE — ED Notes (Addendum)
Pt states he was lying in bed watching tv at 1:30am and started having tightness in center of chest, sob, diaphoresis, and nausea.  Pt woke wife up and told her that he needed an ASA due to chest pain.  He took ASA 325mg  and went outside to get fresh air.  When he came back inside he became lightheaded and fell in bathroom floor.  Denies LOC.

## 2013-01-08 NOTE — ED Provider Notes (Signed)
Medical screening examination/treatment/procedure(s) were performed by non-physician practitioner and as supervising physician I was immediately available for consultation/collaboration.  Jones Skene, M.D.     Jones Skene, MD 01/08/13 2841

## 2013-01-08 NOTE — H&P (Signed)
[facility] History and Physical  Reginald Johnson NGE:[id number] DOB: 17-Jun-1971 DOA: 01/08/2013  Referring physician: ER physician. PCP: No primary provider on file.  Specialists: Dr. Bosie Clos. Gastroenterologist.  Chief Complaint: Chest pain.  HPI: Reginald Johnson is a 42 y.o. male with known history of Crohn's disease started experiencing chest pain around 1:30 AM while watching television. Patient's chest pain was retrosternal nonradiating associated with mild shortness of breath. Later he tried to go to the bathroom and he felt dizzy and fell but did not lose consciousness. His chest pain persisted and he woke his wife up. He took an aspirin. Since the chest pain was persistent he came to the ER. Nitroglycerin helped relieve the pain little bit but had to be given morphine after which his chest pain has resolved. EKG chest x-ray cardiac enzymes are unremarkable and patient has been admitted for further management. D-dimer is pending.  Review of Systems: As presented in the history of presenting illness, rest negative.  Past Medical History  Diagnosis Date  . Crohn disease   . Crohn's    Past Surgical History  Procedure Laterality Date  . Abdominal surgery     Social History:  reports that he has been smoking.  He does not have any smokeless tobacco history on file. He reports that he does not drink alcohol or use illicit drugs. Lives at home. where does patient live-- Can do ADLs. Can patient participate in ADLs?  No Known Allergies  Family History  Problem Relation Age of Onset  . Hypertension Mother   . Breast cancer Other   . Diabetes Mellitus II Other       Prior to Admission medications   Medication Sig Start Date End Date Taking? Authorizing Provider  aspirin 325 MG tablet Take 325 mg by mouth once.   Yes Historical Provider, MD  azaTHIOprine (IMURAN) 50 MG tablet Take 50 mg by mouth daily.     Yes Historical Provider, MD  Cyanocobalamin (VITAMIN B-12 PO) Take 1  tablet by mouth daily.     Yes Historical Provider, MD  mesalamine (ASACOL) 400 MG EC tablet Take 800 mg by mouth 3 (three) times daily.     Yes Historical Provider, MD   Physical Exam: Filed Vitals:   01/08/13 0430 01/08/13 0445 01/08/13 0500 01/08/13 0545  BP: 120/91 124/91 122/90 123/95  Pulse: 60 57 58 53  Temp:      TempSrc:      Resp: 15 13 19 12   SpO2: 100% 100% 100% 100%     General:  Well-developed well-nourished.  Eyes: Anicteric no pallor.  ENT: No discharge from the ears eyes nose mouth.  Neck: No mass felt.  Cardiovascular: S1-S2 heard.  Respiratory: No rhonchi or crepitations.  Abdomen: Soft nontender bowel sounds present.  Skin: No rash.  Musculoskeletal: No edema.  Psychiatric: Appears normal.  Neurologic: Alert awake oriented to time place and person. Moves all extremities.  Labs on Admission:  Basic Metabolic Panel:  Recent Labs Lab 01/08/13 0337  NA 142  K 3.4*  CL 108  CO2 23  GLUCOSE 96  BUN 8  CREATININE 0.88  CALCIUM 9.5   Liver Function Tests: No results found for this basename: AST, ALT, ALKPHOS, BILITOT, PROT, ALBUMIN,  in the last 168 hours No results found for this basename: LIPASE, AMYLASE,  in the last 168 hours No results found for this basename: AMMONIA,  in the last 168 hours CBC:  Recent Labs Lab 01/08/13 0337  WBC 7.1  HGB 14.3  HCT 41.7  MCV 81.9  PLT 299   Cardiac Enzymes:  Recent Labs Lab 01/08/13 0349  TROPONINI <0.30    BNP (last 3 results) No results found for this basename: PROBNP,  in the last 8760 hours CBG: No results found for this basename: GLUCAP,  in the last 168 hours  Radiological Exams on Admission: Dg Chest Port 1 View  01/08/2013   *RADIOLOGY REPORT*  Clinical Data: Central chest pain and shortness of breath; dizziness.  History of smoking.  PORTABLE CHEST - 1 VIEW  Comparison: Chest radiograph performed 11/19/2010  Findings: The lungs are well-aerated and clear.  There is no  evidence of focal opacification, pleural effusion or pneumothorax.  The cardiomediastinal silhouette is within normal limits.  No acute osseous abnormalities are seen.  IMPRESSION: No acute cardiopulmonary process seen.   Original Report Authenticated By: Tonia Ghent, M.D.    EKG: Independently reviewed. Normal sinus rhythm.  Assessment/Plan Principal Problem:   Chest pain Active Problems:   COLITIS   Tobacco abuse   1. Chest pain - as the chest pain-free. Cycle cardiac markers. Check d-dimer. Aspirin. 2. Crohn's disease - continue home medications. 3. Tobacco abuse - strongly advised to quit smoking.  Since patient also felt dizzy initially I have placed patient on gentle hydration and recheck orthostatics. Initial orthostatics was normal as per the nurse.  Code Status: Full code.  Family Communication: Patient's wife at the bedside.  Disposition Plan: Admit for observation.    Osmar Howton N. [facility] Pager [phone].  If 7PM-7AM, please contact night-coverage [email or web address] Password [facility] 01/08/2013, 6:32 AM

## 2013-01-08 NOTE — Progress Notes (Signed)
Patient seen and examined. Admitted earlier today for chest pain. He does not have coronary artery disease risk factors. If he rules out, I do not believe that any further cardiac workup is warranted during this admission. We'll continue to follow and potentially discharge home tomorrow morning.  Peggye Pitt, MD [facility] Pager: [phone]

## 2013-01-08 NOTE — ED Provider Notes (Signed)
Date: 01/08/2013  Rate: 59  Rhythm: normal sinus rhythm  QRS Axis: normal  Intervals: normal  ST/T Wave abnormalities: normal  Conduction Disutrbances: 1st degree AV block   Narrative Interpretation: Subtle flattening of ST segment in V2 and V3, no ST or T wave depression or elevation or hyperacute T waves to indicate acute ischemia or infarction.      Jones Skene, MD 01/08/13 [phone]

## 2013-01-08 NOTE — ED Notes (Signed)
Attempted to call report. Floor RN unable to accept report.  

## 2013-01-08 NOTE — ED Notes (Signed)
Old and new EKG given to Dr Rulon Abide

## 2013-01-08 NOTE — ED Notes (Signed)
Radiology at bedside

## 2013-01-09 DIAGNOSIS — K5289 Other specified noninfective gastroenteritis and colitis: Secondary | ICD-10-CM

## 2013-01-09 LAB — BASIC METABOLIC PANEL
BUN: 6 mg/dL (ref 6–23)
CO2: 25 mEq/L (ref 19–32)
Chloride: 106 mEq/L (ref 96–112)
Glucose, Bld: 102 mg/dL — ABNORMAL HIGH (ref 70–99)
Potassium: 3.4 mEq/L — ABNORMAL LOW (ref 3.5–5.1)

## 2013-01-09 LAB — CBC
MCH: 27.7 pg (ref 26.0–34.0)
MCHC: 33.6 g/dL (ref 30.0–36.0)
RDW: 14.7 % (ref 11.5–15.5)

## 2013-01-09 MED ORDER — NICOTINE 21-14-7 MG/24HR TD KIT: 21.0000 mg | PACK | Freq: Every day | TRANSDERMAL | Status: DC

## 2013-01-09 NOTE — Discharge Summary (Signed)
Physician Discharge Summary  Judge Duque UJW:[id number] DOB: March 17, 1971 DOA: 01/08/2013  PCP: No primary provider on file.  Admit date: 01/08/2013 Discharge date: 01/09/2013  Time spent: Greater than 30 minutes minutes  Recommendations for Outpatient Follow-up:  -Will be discharged home today. -Advised to follow up with his primary care provider in 2 weeks.   Discharge Diagnoses:  Principal Problem:   Chest pain Active Problems:   COLITIS   Tobacco abuse   Discharge Condition: Stable and improved.  Filed Weights   01/08/13 0805  Weight: 76.975 kg (169 lb 11.2 oz)    History of present illness:  Patient is a 42 y.o. male with known history of Crohn's disease started experiencing chest pain around 1:30 AM while watching television. Patient's chest pain was retrosternal nonradiating associated with mild shortness of breath. Later he tried to go to the bathroom and he felt dizzy and fell but did not lose consciousness. His chest pain persisted and he woke his wife up. He took an aspirin. Since the chest pain was persistent he came to the ER. Nitroglycerin helped relieve the pain little bit but had to be given morphine after which his chest pain has resolved. EKG chest x-ray cardiac enzymes are unremarkable and patient has been admitted for further management.   Hospital Course:  Chest Pain -Ruled out for ACS by negative troponins and EKGs without evidence for acute ischemia. -Given he has no CAD, believe he can be discharged home today without further cardiac workup. -I have counseled him on smoking cessation.  Tobacco Abuse -Counseled on cessation. -Would like a prescription for nicotine patch.  Crohn's Disease -Not active this hospitalization. -Continue mesalamine and imuran. -F/u with GI OP as scheduled.  Procedures:  None   Consultations:  None  Discharge Instructions  Discharge Orders   Future Orders Complete By Expires     Discontinue IV  As directed     Increase activity slowly  As directed         Medication List    TAKE these medications       aspirin 325 MG tablet  Take 325 mg by mouth once.     azaTHIOprine 50 MG tablet  Commonly known as:  IMURAN  Take 50 mg by mouth daily.     mesalamine 400 MG EC tablet  Commonly known as:  ASACOL  Take 800 mg by mouth 3 (three) times daily.     VITAMIN B-12 PO  Take 1 tablet by mouth daily.       No Known Allergies     Follow-up Information   Schedule an appointment as soon as possible for a visit in 2 weeks to follow up. (With your regular provider)        The results of significant diagnostics from this hospitalization (including imaging, microbiology, ancillary and laboratory) are listed below for reference.    Significant Diagnostic Studies: Dg Chest Port 1 View  01/08/2013   *RADIOLOGY REPORT*  Clinical Data: Central chest pain and shortness of breath; dizziness.  History of smoking.  PORTABLE CHEST - 1 VIEW  Comparison: Chest radiograph performed 11/19/2010  Findings: The lungs are well-aerated and clear.  There is no evidence of focal opacification, pleural effusion or pneumothorax.  The cardiomediastinal silhouette is within normal limits.  No acute osseous abnormalities are seen.  IMPRESSION: No acute cardiopulmonary process seen.   Original Report Authenticated By: Tonia Ghent, M.D.    Microbiology: No results found for this or any previous visit (from the  past 240 hour(s)).   Labs: Basic Metabolic Panel:  Recent Labs Lab 01/08/13 0337 01/08/13 1920 01/09/13 0345  NA 142  --  138  K 3.4*  --  3.4*  CL 108  --  106  CO2 23  --  25  GLUCOSE 96  --  102*  BUN 8  --  6  CREATININE 0.88 0.98 0.98  CALCIUM 9.5  --  9.0   Liver Function Tests: No results found for this basename: AST, ALT, ALKPHOS, BILITOT, PROT, ALBUMIN,  in the last 168 hours No results found for this basename: LIPASE, AMYLASE,  in the last 168 hours No results found for this basename:  AMMONIA,  in the last 168 hours CBC:  Recent Labs Lab 01/08/13 0337 01/08/13 1920 01/09/13 0345  WBC 7.1 5.4 4.6  HGB 14.3 13.8 13.5  HCT 41.7 40.9 40.2  MCV 81.9 82.0 82.5  PLT 299 307 287   Cardiac Enzymes:  Recent Labs Lab 01/08/13 0349 01/08/13 0743 01/08/13 1245 01/08/13 1821  TROPONINI <0.30 <0.30 <0.30 <0.30   BNP: BNP (last 3 results) No results found for this basename: PROBNP,  in the last 8760 hours CBG: No results found for this basename: GLUCAP,  in the last 168 hours     Signed:  Chaya Jan  [facility] Pager: [phone] 01/09/2013, 10:52 AM

## 2013-01-09 NOTE — Progress Notes (Signed)
Discharge review done with patient.   Patient acknowledged understanding of information provided. Smoking cessation information provided.  Patient is stable and discharged home with wife. Reginald Johnson

## 2013-02-13 ENCOUNTER — Encounter (HOSPITAL_COMMUNITY): Payer: Self-pay | Admitting: *Deleted

## 2013-02-13 ENCOUNTER — Emergency Department (HOSPITAL_COMMUNITY)
Admission: EM | Admit: 2013-02-13 | Discharge: 2013-02-13 | Disposition: A | Discharge status: Home / Self Care | Payer: BC Managed Care – PPO | Attending: Emergency Medicine | Admitting: Emergency Medicine

## 2013-02-13 DIAGNOSIS — Z79899 Other long term (current) drug therapy: Secondary | ICD-10-CM | POA: Insufficient documentation

## 2013-02-13 DIAGNOSIS — M5431 Sciatica, right side: Secondary | ICD-10-CM

## 2013-02-13 DIAGNOSIS — M62830 Muscle spasm of back: Secondary | ICD-10-CM

## 2013-02-13 DIAGNOSIS — M5430 Sciatica, unspecified side: Secondary | ICD-10-CM | POA: Insufficient documentation

## 2013-02-13 DIAGNOSIS — K5090 Crohn's disease, unspecified, without complications: Secondary | ICD-10-CM | POA: Insufficient documentation

## 2013-02-13 DIAGNOSIS — M5380 Other specified dorsopathies, site unspecified: Secondary | ICD-10-CM | POA: Insufficient documentation

## 2013-02-13 DIAGNOSIS — F17200 Nicotine dependence, unspecified, uncomplicated: Secondary | ICD-10-CM | POA: Insufficient documentation

## 2013-02-13 DIAGNOSIS — Z87828 Personal history of other (healed) physical injury and trauma: Secondary | ICD-10-CM | POA: Insufficient documentation

## 2013-02-13 MED ORDER — OXYCODONE-ACETAMINOPHEN 5-325 MG PO TABS
1.0000 | ORAL_TABLET | Freq: Once | ORAL | Status: AC
  Administered 2013-02-13: 1 via ORAL
  Filled 2013-02-13: qty 1

## 2013-02-13 MED ORDER — OXYCODONE-ACETAMINOPHEN 5-325 MG PO TABS: 1.0000 | ORAL_TABLET | Freq: Once | ORAL | Status: DC

## 2013-02-13 MED ORDER — PREDNISONE 20 MG PO TABS: 60.0000 mg | ORAL_TABLET | Freq: Every day | ORAL | Status: DC

## 2013-02-13 NOTE — ED Provider Notes (Signed)
History  This chart was scribed for non-physician practitioner working with Carleene Cooper III, MD. This patient was seen in room [facility] and the patient's care was started at 7:00 PM.  CSN: [id number]  Arrival date & time 02/13/13  1846   Chief Complaint  Patient presents with  . Leg Pain    The history is provided by the patient. No language interpreter was used.   HPI Comments: Reginald Johnson is a 42 y.o. male who presents to the Emergency Department complaining of constant, gradually worsening moderate right leg pain of 2 days duration. Pt states that he injured his back on 02/04/13 while lifting a cooler. He states that he was seen at Urgent Care for this, given Prednisone and muscle relaxants and his back pain had subsided. Pt states that he has developed right sided posterior sharp burning leg pain w/ radiation from buttock to mid thigh in the past few days that only aggravates him when standing up or ambulating. He rates his pain 7/10 while ambulating. He denies hx of similar pain. He states that he has been taking care of his leg with rest, ice, elevation, Bengay and Icy Hot with minimal relief. Pt denies fever, chill, numbness, tingling, bowel incontinence, bladder incontinence or any other symptoms. Pt states that he has a PCP. Pt denies alcohol use and is a current some day smoker.   Past Medical History  Diagnosis Date  . Crohn disease   . Crohn's    Past Surgical History  Procedure Laterality Date  . Abdominal surgery     Family History  Problem Relation Age of Onset  . Hypertension Mother   . Breast cancer Other   . Diabetes Mellitus II Other    History  Substance Use Topics  . Smoking status: Current Some Day Smoker -- 0.50 packs/day for 15 years    Types: Cigarettes  . Smokeless tobacco: Not on file  . Alcohol Use: No    Review of Systems  Constitutional: Negative for fever and chills.  Gastrointestinal:       Denies bowel incontinence.  Genitourinary:      Denies bladder incontinence.  Musculoskeletal: Positive for back pain (subsided).  Neurological: Positive for weakness. Negative for numbness.    Allergies  Review of patient's allergies indicates no known allergies.  Home Medications   Current Outpatient Rx  Name  Route  Sig  Dispense  Refill  . aspirin 325 MG tablet   Oral   Take 325 mg by mouth once.         Marland Kitchen azaTHIOprine (IMURAN) 50 MG tablet   Oral   Take 50 mg by mouth daily.           . Cyanocobalamin (VITAMIN B-12 PO)   Oral   Take 1 tablet by mouth daily.           . mesalamine (ASACOL) 400 MG EC tablet   Oral   Take 800 mg by mouth 3 (three) times daily.           . Nicotine 21-14-7 MG/24HR KIT   Transdermal   Place 21 mg onto the skin daily. Use as indicated.   1 each   0   . oxyCODONE-acetaminophen (PERCOCET/ROXICET) 5-325 MG per tablet   Oral   Take 1 tablet by mouth once.   30 tablet   0   . predniSONE (DELTASONE) 20 MG tablet   Oral   Take 3 tablets (60 mg total) by mouth daily.  15 tablet   0    BP 156/95  Pulse 109  Temp(Src) 98.8 F (37.1 C) (Oral)  Resp 18  SpO2 98%  Physical Exam  Constitutional: He is oriented to person, place, and time. He appears well-developed and well-nourished. No distress.  HENT:  Head: Normocephalic and atraumatic.  Eyes: Conjunctivae are normal.  Neck: Neck supple.  Musculoskeletal:       Lumbar back: He exhibits decreased range of motion and spasm.  Decreased ROM and strength due to pain. Able to ambulate, but endorses pain.  Neurological: He is alert and oriented to person, place, and time. No sensory deficit.  Skin: Skin is warm and dry. He is not diaphoretic.  Psychiatric: He has a normal mood and affect.    ED Course  Procedures (including critical care time)  DIAGNOSTIC STUDIES: Oxygen Saturation is 98% on RA, normal by my interpretation.    COORDINATION OF CARE: 7:05 PM- Pt advised of plan to receive Oxycodone and prednisone  and pt agrees. Pt states that he has a PCP and will plan to follow-up.  Medications  oxyCODONE-acetaminophen (PERCOCET/ROXICET) 5-325 MG per tablet 1 tablet (1 tablet Oral Given 02/13/13 1922)    Labs Reviewed - No data to display  No results found.  1. Sciatica of right side   2. Muscle spasm of back     MDM   Patient with right sided back spasm w/ pain radiating down posterior right leg.  No neurological deficits and normal neuro exam.  Patient can walk but states is painful.  No loss of bowel or bladder control.  No concern for cauda equina.  No fever, night sweats, weight loss, h/o cancer, IVDU.  RICE protocol and pain medicine indicated and discussed with patient. Advised f/u with PCP at completion of medication course. Return precautions discussed. Patient is agreeable to plan. Patient is stable at time of discharge              I personally performed the services described in this documentation, which was scribed in my presence. The recorded information has been reviewed and is accurate.     Jeannetta Ellis, PA-C 02/13/13 1940

## 2013-02-13 NOTE — ED Notes (Signed)
Pt states on the 4th was lifting a cooler of ice and was seen at urgent care on Sunday with treatment of prednisone and muscle relaxer.  Pt now with right leg pain

## 2013-02-14 NOTE — ED Provider Notes (Signed)
Medical screening examination/treatment/procedure(s) were performed by non-physician practitioner and as supervising physician I was immediately available for consultation/collaboration.   Carleene Cooper III, MD 02/14/13 Windy Fast

## 2013-05-06 ENCOUNTER — Encounter (HOSPITAL_COMMUNITY): Payer: Self-pay | Admitting: *Deleted

## 2013-05-06 ENCOUNTER — Emergency Department (HOSPITAL_COMMUNITY)
Admission: EM | Admit: 2013-05-06 | Discharge: 2013-05-06 | Disposition: A | Discharge status: Home / Self Care | Payer: BC Managed Care – PPO | Attending: Emergency Medicine | Admitting: Emergency Medicine

## 2013-05-06 DIAGNOSIS — Z79899 Other long term (current) drug therapy: Secondary | ICD-10-CM | POA: Insufficient documentation

## 2013-05-06 DIAGNOSIS — G8921 Chronic pain due to trauma: Secondary | ICD-10-CM | POA: Insufficient documentation

## 2013-05-06 DIAGNOSIS — Z8719 Personal history of other diseases of the digestive system: Secondary | ICD-10-CM | POA: Insufficient documentation

## 2013-05-06 DIAGNOSIS — M5410 Radiculopathy, site unspecified: Secondary | ICD-10-CM

## 2013-05-06 DIAGNOSIS — F17200 Nicotine dependence, unspecified, uncomplicated: Secondary | ICD-10-CM | POA: Insufficient documentation

## 2013-05-06 DIAGNOSIS — IMO0002 Reserved for concepts with insufficient information to code with codable children: Secondary | ICD-10-CM | POA: Insufficient documentation

## 2013-05-06 MED ORDER — NAPROXEN 500 MG PO TABS: 500.0000 mg | ORAL_TABLET | Freq: Two times a day (BID) | ORAL | Status: DC

## 2013-05-06 MED ORDER — PREDNISONE 10 MG PO TABS: 50.0000 mg | ORAL_TABLET | Freq: Every day | ORAL | Status: DC

## 2013-05-06 NOTE — ED Provider Notes (Signed)
CSN: [id number]     Arrival date & time 05/06/13  1952 History  This chart was scribed for Felicie Morn, NP working with Darlys Gales, MD by Carl Best, ED Scribe. This patient was seen in room TR06C/TR06C and the patient's care was started at 9:35 PM.    Chief Complaint  Patient presents with  . Leg Pain    Patient is a 42 y.o. male presenting with leg pain. The history is provided by the patient. No language interpreter was used.  Leg Pain Location:  Leg Time since incident:  4 months Injury: no   Leg location:  R leg Pain details:    Radiates to: right buttock.   Severity:  Moderate   Onset quality:  Sudden   Timing:  Constant   Progression:  Unchanged Chronicity:  New Relieved by:  Nothing Worsened by:  Exercise and bearing weight (walking) Ineffective treatments: Tylenol and Aleve. Associated symptoms: no back pain    HPI Comments: Reginald Johnson is a 42 y.o. male who presents to the Emergency Department complaining of constant right leg pain radiating from his ankle to his right buttock.  The patient states that he was lifting a cooler on February 04, 2013 and he felt a pain in his back that radiated to his right leg.  The patient denies back pain as an associated symptom.  He states that he took Advil and Aleve three times a day since the incident and did not experience any relief in the leg pain.  The patient states that walking aggravates the pain.  The patient does not have a PCP.  He states that he has insurance through [employer].    Past Medical History  Diagnosis Date  . Crohn disease   . Crohn's    Past Surgical History  Procedure Laterality Date  . Abdominal surgery     Family History  Problem Relation Age of Onset  . Hypertension Mother   . Breast cancer Other   . Diabetes Mellitus II Other    History  Substance Use Topics  . Smoking status: Current Some Day Smoker -- 0.50 packs/day for 15 years    Types: Cigarettes  . Smokeless tobacco: Not on  file  . Alcohol Use: No    Review of Systems  Musculoskeletal: Positive for arthralgias (right leg pain). Negative for back pain.  All other systems reviewed and are negative.    Allergies  Review of patient's allergies indicates no known allergies.  Home Medications   Current Outpatient Rx  Name  Route  Sig  Dispense  Refill  . aspirin 325 MG tablet   Oral   Take 325 mg by mouth once.         Marland Kitchen azaTHIOprine (IMURAN) 50 MG tablet   Oral   Take 50 mg by mouth daily.           . Cyanocobalamin (VITAMIN B-12 PO)   Oral   Take 1 tablet by mouth daily.           . mesalamine (ASACOL) 400 MG EC tablet   Oral   Take 800 mg by mouth 3 (three) times daily.           . Nicotine 21-14-7 MG/24HR KIT   Transdermal   Place 21 mg onto the skin daily. Use as indicated.   1 each   0   . oxyCODONE-acetaminophen (PERCOCET/ROXICET) 5-325 MG per tablet   Oral   Take 1 tablet by mouth  once.   30 tablet   0   . predniSONE (DELTASONE) 20 MG tablet   Oral   Take 3 tablets (60 mg total) by mouth daily.   15 tablet   0    Triage Vitals: BP 137/85  Pulse 64  Temp(Src) 98.5 F (36.9 C)  Resp 18  Ht 5\' 11"  (1.803 m)  Wt 180 lb (81.647 kg)  BMI 25.12 kg/m2  SpO2 99%  Physical Exam  Nursing note and vitals reviewed. Constitutional: He is oriented to person, place, and time. He appears well-developed and well-nourished. No distress.  HENT:  Head: Normocephalic and atraumatic.  Right Ear: External ear normal.  Left Ear: External ear normal.  Nose: Nose normal.  Mouth/Throat: Oropharynx is clear and moist.  Eyes: Conjunctivae and EOM are normal. Pupils are equal, round, and reactive to light.  Neck: Normal range of motion. Neck supple. No thyromegaly present.  Cardiovascular: Normal rate, regular rhythm and normal heart sounds.   Pulmonary/Chest: Effort normal and breath sounds normal.  Abdominal: Soft. Bowel sounds are normal. There is no tenderness.   Musculoskeletal: Normal range of motion.  Pain in his right leg.  Lymphadenopathy:    He has no cervical adenopathy.  Neurological: He is alert and oriented to person, place, and time.  Normal strength in his right leg.  Skin: Skin is warm and dry. He is not diaphoretic.  Psychiatric: He has a normal mood and affect.    ED Course  Procedures (including critical care time)  DIAGNOSTIC STUDIES: Oxygen Saturation is 99% on room air, normal by my interpretation.    COORDINATION OF CARE: 9:37 PM- Discussed a clinical suspicion of an inflamed nerve in the patient's back.  Discussed starting the patient on steroids and discharging the patient with a prescription for Naprosyn.  Advised the patient to follow up with his insurance provider to find an orthopedic doctor.  The patient agreed to the treatment plan.     Labs Review Labs Reviewed - No data to display Imaging Review No results found. No red flag symptoms. MDM   1. Radicular pain of right lower extremity    I personally performed the services described in this documentation, which was scribed in my presence. The recorded information has been reviewed and is accurate.   Jimmye Norman, NP 05/07/13 [phone]

## 2013-05-06 NOTE — ED Notes (Addendum)
Pt states he lifted a cooler with ice on the 4th of July and hurt his back at the time and the pain has now travelled to his right buttocks and down to the right leg.

## 2013-05-06 NOTE — ED Notes (Signed)
Rt leg pain since he struck it against a cooler July 4th.  The pain goes from his back to his rt leg

## 2013-05-07 NOTE — ED Provider Notes (Signed)
Medical screening examination/treatment/procedure(s) were performed by non-physician practitioner and as supervising physician I was immediately available for consultation/collaboration.  Darlys Gales, MD 05/07/13 [phone]

## 2014-02-17 ENCOUNTER — Encounter: Payer: BC Managed Care – PPO | Admitting: Family Medicine

## 2014-04-21 ENCOUNTER — Encounter: Payer: Self-pay | Admitting: Family Medicine

## 2014-04-21 ENCOUNTER — Ambulatory Visit (INDEPENDENT_AMBULATORY_CARE_PROVIDER_SITE_OTHER): Payer: BC Managed Care – PPO | Admitting: Family Medicine

## 2014-04-21 VITALS — BP 137/91 | HR 70 | Temp 98.2°F | Ht 70.0 in | Wt 187.6 lb

## 2014-04-21 DIAGNOSIS — Z0000 Encounter for general adult medical examination without abnormal findings: Secondary | ICD-10-CM

## 2014-04-21 LAB — BASIC METABOLIC PANEL
BUN: 7 mg/dL (ref 6–23)
CHLORIDE: 105 meq/L (ref 96–112)
CO2: 24 mEq/L (ref 19–32)
Calcium: 9.4 mg/dL (ref 8.4–10.5)
Creatinine, Ser: 1 mg/dL (ref 0.4–1.5)
GFR: 105.47 mL/min (ref >60.00)
Glucose, Bld: 93 mg/dL (ref 70–99)
POTASSIUM: 3 meq/L — AB (ref 3.5–5.1)
Sodium: 139 mEq/L (ref 135–145)

## 2014-04-21 LAB — CBC WITH DIFFERENTIAL/PLATELET
BASOS PCT: 1 % (ref 0.0–3.0)
Basophils Absolute: 0.1 10*3/uL (ref 0.0–0.1)
EOS PCT: 2.3 % (ref 0.0–5.0)
Eosinophils Absolute: 0.1 10*3/uL (ref 0.0–0.7)
HEMATOCRIT: 45.2 % (ref 39.0–52.0)
Hemoglobin: 14.9 g/dL (ref 13.0–17.0)
LYMPHS ABS: 2 10*3/uL (ref 0.7–4.0)
Lymphocytes Relative: 31 % (ref 12.0–46.0)
MCHC: 32.9 g/dL (ref 30.0–36.0)
MCV: 84.8 fl (ref 78.0–100.0)
MONO ABS: 0.7 10*3/uL (ref 0.1–1.0)
Monocytes Relative: 11.1 % (ref 3.0–12.0)
Neutro Abs: 3.4 10*3/uL (ref 1.4–7.7)
Neutrophils Relative %: 54.6 % (ref 43.0–77.0)
PLATELETS: 344 10*3/uL (ref 150.0–400.0)
RBC: 5.33 Mil/uL (ref 4.22–5.81)
RDW: 15 % (ref 11.5–15.5)
WBC: 6.3 10*3/uL (ref 4.0–10.5)

## 2014-04-21 LAB — HEPATIC FUNCTION PANEL
ALBUMIN: 4.3 g/dL (ref 3.5–5.2)
ALT: 12 U/L (ref 0–53)
AST: 19 U/L (ref 0–37)
Alkaline Phosphatase: 75 U/L (ref 39–117)
Bilirubin, Direct: 0 mg/dL (ref 0.0–0.3)
TOTAL PROTEIN: 7.3 g/dL (ref 6.0–8.3)
Total Bilirubin: 0.6 mg/dL (ref 0.2–1.2)

## 2014-04-21 LAB — TSH: TSH: 1.85 u[IU]/mL (ref 0.35–4.50)

## 2014-04-21 LAB — LIPID PANEL
CHOLESTEROL: 213 mg/dL — AB (ref 0–200)
HDL: 33.6 mg/dL — ABNORMAL LOW (ref >39.00)
NonHDL: 179.4
TRIGLYCERIDES: 244 mg/dL — AB (ref 0.0–149.0)
Total CHOL/HDL Ratio: 6
VLDL: 48.8 mg/dL — ABNORMAL HIGH (ref 0.0–40.0)

## 2014-04-21 LAB — LDL CHOLESTEROL, DIRECT: LDL DIRECT: 151.2 mg/dL

## 2014-04-21 LAB — PSA: PSA: 0.27 ng/mL (ref 0.10–4.00)

## 2014-04-21 NOTE — Patient Instructions (Signed)
Preventive Care for Adults A healthy lifestyle and preventive care can promote health and wellness. Preventive health guidelines for men include the following key practices:  A routine yearly physical is a good way to check with your health care provider about your health and preventative screening. It is a chance to share any concerns and updates on your health and to receive a thorough exam.  Visit your dentist for a routine exam and preventative care every 6 months. Brush your teeth twice a day and floss once a day. Good oral hygiene prevents tooth decay and gum disease.  The frequency of eye exams is based on your age, health, family medical history, use of contact lenses, and other factors. Follow your health care provider's recommendations for frequency of eye exams.  Eat a healthy diet. Foods such as vegetables, fruits, whole grains, low-fat dairy products, and lean protein foods contain the nutrients you need without too many calories. Decrease your intake of foods high in solid fats, added sugars, and salt. Eat the right amount of calories for you.Get information about a proper diet from your health care provider, if necessary.  Regular physical exercise is one of the most important things you can do for your health. Most adults should get at least 150 minutes of moderate-intensity exercise (any activity that increases your heart rate and causes you to sweat) each week. In addition, most adults need muscle-strengthening exercises on 2 or more days a week.  Maintain a healthy weight. The body mass index (BMI) is a screening tool to identify possible weight problems. It provides an estimate of body fat based on height and weight. Your health care provider can find your BMI and can help you achieve or maintain a healthy weight.For adults 20 years and older:  A BMI below 18.5 is considered underweight.  A BMI of 18.5 to 24.9 is normal.  A BMI of 25 to 29.9 is considered overweight.  A BMI  of 30 and above is considered obese.  Maintain normal blood lipids and cholesterol levels by exercising and minimizing your intake of saturated fat. Eat a balanced diet with plenty of fruit and vegetables. Blood tests for lipids and cholesterol should begin at age 50 and be repeated every 5 years. If your lipid or cholesterol levels are high, you are over 50, or you are at high risk for heart disease, you may need your cholesterol levels checked more frequently.Ongoing high lipid and cholesterol levels should be treated with medicines if diet and exercise are not working.  If you smoke, find out from your health care provider how to quit. If you do not use tobacco, do not start.  Lung cancer screening is recommended for adults aged 73-80 years who are at high risk for developing lung cancer because of a history of smoking. A yearly low-dose CT scan of the lungs is recommended for people who have at least a 30-pack-year history of smoking and are a current smoker or have quit within the past 15 years. A pack year of smoking is smoking an average of 1 pack of cigarettes a day for 1 year (for example: 1 pack a day for 30 years or 2 packs a day for 15 years). Yearly screening should continue until the smoker has stopped smoking for at least 15 years. Yearly screening should be stopped for people who develop a health problem that would prevent them from having lung cancer treatment.  If you choose to drink alcohol, do not have more than  2 drinks per day. One drink is considered to be 12 ounces (355 mL) of beer, 5 ounces (148 mL) of wine, or 1.5 ounces (44 mL) of liquor.  Avoid use of street drugs. Do not share needles with anyone. Ask for help if you need support or instructions about stopping the use of drugs.  High blood pressure causes heart disease and increases the risk of stroke. Your blood pressure should be checked at least every 1-2 years. Ongoing high blood pressure should be treated with  medicines, if weight loss and exercise are not effective.  If you are 45-79 years old, ask your health care provider if you should take aspirin to prevent heart disease.  Diabetes screening involves taking a blood sample to check your fasting blood sugar level. This should be done once every 3 years, after age 45, if you are within normal weight and without risk factors for diabetes. Testing should be considered at a younger age or be carried out more frequently if you are overweight and have at least 1 risk factor for diabetes.  Colorectal cancer can be detected and often prevented. Most routine colorectal cancer screening begins at the age of 50 and continues through age 75. However, your health care provider may recommend screening at an earlier age if you have risk factors for colon cancer. On a yearly basis, your health care provider may provide home test kits to check for hidden blood in the stool. Use of a small camera at the end of a tube to directly examine the colon (sigmoidoscopy or colonoscopy) can detect the earliest forms of colorectal cancer. Talk to your health care provider about this at age 50, when routine screening begins. Direct exam of the colon should be repeated every 5-10 years through age 75, unless early forms of precancerous polyps or small growths are found.  People who are at an increased risk for hepatitis B should be screened for this virus. You are considered at high risk for hepatitis B if:  You were born in a country where hepatitis B occurs often. Talk with your health care provider about which countries are considered high risk.  Your parents were born in a high-risk country and you have not received a shot to protect against hepatitis B (hepatitis B vaccine).  You have HIV or AIDS.  You use needles to inject street drugs.  You live with, or have sex with, someone who has hepatitis B.  You are a man who has sex with other men (MSM).  You get hemodialysis  treatment.  You take certain medicines for conditions such as cancer, organ transplantation, and autoimmune conditions.  Hepatitis C blood testing is recommended for all people born from 1945 through 1965 and any individual with known risks for hepatitis C.  Practice safe sex. Use condoms and avoid high-risk sexual practices to reduce the spread of sexually transmitted infections (STIs). STIs include gonorrhea, chlamydia, syphilis, trichomonas, herpes, HPV, and human immunodeficiency virus (HIV). Herpes, HIV, and HPV are viral illnesses that have no cure. They can result in disability, cancer, and death.  If you are at risk of being infected with HIV, it is recommended that you take a prescription medicine daily to prevent HIV infection. This is called preexposure prophylaxis (PrEP). You are considered at risk if:  You are a man who has sex with other men (MSM) and have other risk factors.  You are a heterosexual man, are sexually active, and are at increased risk for HIV infection.    You take drugs by injection.  You are sexually active with a partner who has HIV.  Talk with your health care provider about whether you are at high risk of being infected with HIV. If you choose to begin PrEP, you should first be tested for HIV. You should then be tested every 3 months for as long as you are taking PrEP.  A one-time screening for abdominal aortic aneurysm (AAA) and surgical repair of large AAAs by ultrasound are recommended for men ages 32 to 67 years who are current or former smokers.  Healthy men should no longer receive prostate-specific antigen (PSA) blood tests as part of routine cancer screening. Talk with your health care provider about prostate cancer screening.  Testicular cancer screening is not recommended for adult males who have no symptoms. Screening includes self-exam, a health care provider exam, and other screening tests. Consult with your health care provider about any symptoms  you have or any concerns you have about testicular cancer.  Use sunscreen. Apply sunscreen liberally and repeatedly throughout the day. You should seek shade when your shadow is shorter than you. Protect yourself by wearing long sleeves, pants, a wide-brimmed hat, and sunglasses year round, whenever you are outdoors.  Once a month, do a whole-body skin exam, using a mirror to look at the skin on your back. Tell your health care provider about new moles, moles that have irregular borders, moles that are larger than a pencil eraser, or moles that have changed in shape or color.  Stay current with required vaccines (immunizations).  Influenza vaccine. All adults should be immunized every year.  Tetanus, diphtheria, and acellular pertussis (Td, Tdap) vaccine. An adult who has not previously received Tdap or who does not know his vaccine status should receive 1 dose of Tdap. This initial dose should be followed by tetanus and diphtheria toxoids (Td) booster doses every 10 years. Adults with an unknown or incomplete history of completing a 3-dose immunization series with Td-containing vaccines should begin or complete a primary immunization series including a Tdap dose. Adults should receive a Td booster every 10 years.  Varicella vaccine. An adult without evidence of immunity to varicella should receive 2 doses or a second dose if he has previously received 1 dose.  Human papillomavirus (HPV) vaccine. Males aged 68-21 years who have not received the vaccine previously should receive the 3-dose series. Males aged 22-26 years may be immunized. Immunization is recommended through the age of 6 years for any male who has sex with males and did not get any or all doses earlier. Immunization is recommended for any person with an immunocompromised condition through the age of 49 years if he did not get any or all doses earlier. During the 3-dose series, the second dose should be obtained 4-8 weeks after the first  dose. The third dose should be obtained 24 weeks after the first dose and 16 weeks after the second dose.  Zoster vaccine. One dose is recommended for adults aged 50 years or older unless certain conditions are present.  Measles, mumps, and rubella (MMR) vaccine. Adults born before [birth year] generally are considered immune to measles and mumps. Adults born in [birth year] or later should have 1 or more doses of MMR vaccine unless there is a contraindication to the vaccine or there is laboratory evidence of immunity to each of the three diseases. A routine second dose of MMR vaccine should be obtained at least 28 days after the first dose for students attending postsecondary  schools, health care workers, or international travelers. People who received inactivated measles vaccine or an unknown type of measles vaccine during 1963-1967 should receive 2 doses of MMR vaccine. People who received inactivated mumps vaccine or an unknown type of mumps vaccine before 1979 and are at high risk for mumps infection should consider immunization with 2 doses of MMR vaccine. Unvaccinated health care workers born before 1957 who lack laboratory evidence of measles, mumps, or rubella immunity or laboratory confirmation of disease should consider measles and mumps immunization with 2 doses of MMR vaccine or rubella immunization with 1 dose of MMR vaccine.  Pneumococcal 13-valent conjugate (PCV13) vaccine. When indicated, a person who is uncertain of his immunization history and has no record of immunization should receive the PCV13 vaccine. An adult aged 19 years or older who has certain medical conditions and has not been previously immunized should receive 1 dose of PCV13 vaccine. This PCV13 should be followed with a dose of pneumococcal polysaccharide (PPSV23) vaccine. The PPSV23 vaccine dose should be obtained at least 8 weeks after the dose of PCV13 vaccine. An adult aged 19 years or older who has certain medical conditions and  previously received 1 or more doses of PPSV23 vaccine should receive 1 dose of PCV13. The PCV13 vaccine dose should be obtained 1 or more years after the last PPSV23 vaccine dose.  Pneumococcal polysaccharide (PPSV23) vaccine. When PCV13 is also indicated, PCV13 should be obtained first. All adults aged 65 years and older should be immunized. An adult younger than age 65 years who has certain medical conditions should be immunized. Any person who resides in a nursing home or long-term care facility should be immunized. An adult smoker should be immunized. People with an immunocompromised condition and certain other conditions should receive both PCV13 and PPSV23 vaccines. People with human immunodeficiency virus (HIV) infection should be immunized as soon as possible after diagnosis. Immunization during chemotherapy or radiation therapy should be avoided. Routine use of PPSV23 vaccine is not recommended for American Indians, [place] Natives, or people younger than 65 years unless there are medical conditions that require PPSV23 vaccine. When indicated, people who have unknown immunization and have no record of immunization should receive PPSV23 vaccine. One-time revaccination 5 years after the first dose of PPSV23 is recommended for people aged 19-64 years who have chronic kidney failure, nephrotic syndrome, asplenia, or immunocompromised conditions. People who received 1-2 doses of PPSV23 before age 65 years should receive another dose of PPSV23 vaccine at age 65 years or later if at least 5 years have passed since the previous dose. Doses of PPSV23 are not needed for people immunized with PPSV23 at or after age 65 years.  Meningococcal vaccine. Adults with asplenia or persistent complement component deficiencies should receive 2 doses of quadrivalent meningococcal conjugate (MenACWY-D) vaccine. The doses should be obtained at least 2 months apart. Microbiologists working with certain meningococcal bacteria,  military recruits, people at risk during an outbreak, and people who travel to or live in countries with a high rate of meningitis should be immunized. A first-year college student up through age 21 years who is living in a residence hall should receive a dose if he did not receive a dose on or after his 16th birthday. Adults who have certain high-risk conditions should receive one or more doses of vaccine.  Hepatitis A vaccine. Adults who wish to be protected from this disease, have certain high-risk conditions, work with hepatitis A-infected animals, work in hepatitis A research labs, or   travel to or work in countries with a high rate of hepatitis A should be immunized. Adults who were previously unvaccinated and who anticipate close contact with an international adoptee during the first 60 days after arrival in the [place] from a country with a high rate of hepatitis A should be immunized.  Hepatitis B vaccine. Adults should be immunized if they wish to be protected from this disease, have certain high-risk conditions, may be exposed to blood or other infectious body fluids, are household contacts or sex partners of hepatitis B positive people, are clients or workers in certain care facilities, or travel to or work in countries with a high rate of hepatitis B.  Haemophilus influenzae type b (Hib) vaccine. A previously unvaccinated person with asplenia or sickle cell disease or having a scheduled splenectomy should receive 1 dose of Hib vaccine. Regardless of previous immunization, a recipient of a hematopoietic stem cell transplant should receive a 3-dose series 6-12 months after his successful transplant. Hib vaccine is not recommended for adults with HIV infection. Preventive Service / Frequency Ages 52 to 17  Blood pressure check.** / Every 1 to 2 years.  Lipid and cholesterol check.** / Every 5 years beginning at age 69.  Hepatitis C blood test.** / For any individual with known risks for  hepatitis C.  Skin self-exam. / Monthly.  Influenza vaccine. / Every year.  Tetanus, diphtheria, and acellular pertussis (Tdap, Td) vaccine.** / Consult your health care provider. 1 dose of Td every 10 years.  Varicella vaccine.** / Consult your health care provider.  HPV vaccine. / 3 doses over 6 months, if 72 or younger.  Measles, mumps, rubella (MMR) vaccine.** / You need at least 1 dose of MMR if you were born in 1957 or later. You may also need a second dose.  Pneumococcal 13-valent conjugate (PCV13) vaccine.** / Consult your health care provider.  Pneumococcal polysaccharide (PPSV23) vaccine.** / 1 to 2 doses if you smoke cigarettes or if you have certain conditions.  Meningococcal vaccine.** / 1 dose if you are age 35 to 60 years and a Market researcher living in a residence hall, or have one of several medical conditions. You may also need additional booster doses.  Hepatitis A vaccine.** / Consult your health care provider.  Hepatitis B vaccine.** / Consult your health care provider.  Haemophilus influenzae type b (Hib) vaccine.** / Consult your health care provider. Ages 35 to 8  Blood pressure check.** / Every 1 to 2 years.  Lipid and cholesterol check.** / Every 5 years beginning at age 57.  Lung cancer screening. / Every year if you are aged 44-80 years and have a 30-pack-year history of smoking and currently smoke or have quit within the past 15 years. Yearly screening is stopped once you have quit smoking for at least 15 years or develop a health problem that would prevent you from having lung cancer treatment.  Fecal occult blood test (FOBT) of stool. / Every year beginning at age 55 and continuing until age 73. You may not have to do this test if you get a colonoscopy every 10 years.  Flexible sigmoidoscopy** or colonoscopy.** / Every 5 years for a flexible sigmoidoscopy or every 10 years for a colonoscopy beginning at age 28 and continuing until age  1.  Hepatitis C blood test.** / For all people born from [birth year] through 1965 and any individual with known risks for hepatitis C.  Skin self-exam. / Monthly.  Influenza vaccine. / Every  year.  Tetanus, diphtheria, and acellular pertussis (Tdap/Td) vaccine.** / Consult your health care provider. 1 dose of Td every 10 years.  Varicella vaccine.** / Consult your health care provider.  Zoster vaccine.** / 1 dose for adults aged 53 years or older.  Measles, mumps, rubella (MMR) vaccine.** / You need at least 1 dose of MMR if you were born in 1957 or later. You may also need a second dose.  Pneumococcal 13-valent conjugate (PCV13) vaccine.** / Consult your health care provider.  Pneumococcal polysaccharide (PPSV23) vaccine.** / 1 to 2 doses if you smoke cigarettes or if you have certain conditions.  Meningococcal vaccine.** / Consult your health care provider.  Hepatitis A vaccine.** / Consult your health care provider.  Hepatitis B vaccine.** / Consult your health care provider.  Haemophilus influenzae type b (Hib) vaccine.** / Consult your health care provider. Ages 77 and over  Blood pressure check.** / Every 1 to 2 years.  Lipid and cholesterol check.**/ Every 5 years beginning at age 85.  Lung cancer screening. / Every year if you are aged 55-80 years and have a 30-pack-year history of smoking and currently smoke or have quit within the past 15 years. Yearly screening is stopped once you have quit smoking for at least 15 years or develop a health problem that would prevent you from having lung cancer treatment.  Fecal occult blood test (FOBT) of stool. / Every year beginning at age 33 and continuing until age 11. You may not have to do this test if you get a colonoscopy every 10 years.  Flexible sigmoidoscopy** or colonoscopy.** / Every 5 years for a flexible sigmoidoscopy or every 10 years for a colonoscopy beginning at age 28 and continuing until age 73.  Hepatitis C blood  test.** / For all people born from [birth year] through 1965 and any individual with known risks for hepatitis C.  Abdominal aortic aneurysm (AAA) screening.** / A one-time screening for ages 50 to 27 years who are current or former smokers.  Skin self-exam. / Monthly.  Influenza vaccine. / Every year.  Tetanus, diphtheria, and acellular pertussis (Tdap/Td) vaccine.** / 1 dose of Td every 10 years.  Varicella vaccine.** / Consult your health care provider.  Zoster vaccine.** / 1 dose for adults aged 34 years or older.  Pneumococcal 13-valent conjugate (PCV13) vaccine.** / Consult your health care provider.  Pneumococcal polysaccharide (PPSV23) vaccine.** / 1 dose for all adults aged 63 years and older.  Meningococcal vaccine.** / Consult your health care provider.  Hepatitis A vaccine.** / Consult your health care provider.  Hepatitis B vaccine.** / Consult your health care provider.  Haemophilus influenzae type b (Hib) vaccine.** / Consult your health care provider. **Family history and personal history of risk and conditions may change your health care provider's recommendations. Document Released: 09/16/2001 Document Revised: 07/26/2013 Document Reviewed: 12/16/2010 [facility] Patient Information 2015 [place], [place]. This information is not intended to replace advice given to you by your health care provider. Make sure you discuss any questions you have with your health care provider.

## 2014-04-21 NOTE — Progress Notes (Signed)
Subjective:    Patient ID: Reginald Johnson, male    DOB: Jun 13, 1971, 43 y.o.   MRN: [id number]  HPI Pt here for cpe and labs.    Review of Systems  Constitutional: Negative.   HENT: Negative for congestion, ear pain, hearing loss, nosebleeds, postnasal drip, rhinorrhea, sinus pressure, sneezing and tinnitus.   Eyes: Negative for photophobia, discharge, itching and visual disturbance.  Respiratory: Negative.   Cardiovascular: Negative.   Gastrointestinal: Negative for abdominal pain, constipation, blood in stool, abdominal distention and anal bleeding.  Endocrine: Negative.   Genitourinary: Negative.   Musculoskeletal: Negative.   Skin: Negative.   Allergic/Immunologic: Negative.   Neurological: Negative for dizziness, weakness, light-headedness, numbness and headaches.  Psychiatric/Behavioral: Negative for suicidal ideas, confusion, sleep disturbance, dysphoric mood, decreased concentration and agitation. The patient is not nervous/anxious.    Past Medical History  Diagnosis Date  . Crohn disease   . Crohn's    History   Social History  . Marital Status: Married    Spouse Name: N/A    Number of Children: N/A  . Years of Education: N/A   Occupational History  . Not on file.   Social History Main Topics  . Smoking status: Current Some Day Smoker -- 0.50 packs/day for 15 years    Types: Cigarettes  . Smokeless tobacco: Not on file  . Alcohol Use: No  . Drug Use: No  . Sexual Activity: Yes   Other Topics Concern  . Not on file   Social History Narrative  . No narrative on file   Family History  Problem Relation Age of Onset  . Hypertension Mother   . Breast cancer Other   . Diabetes Mellitus II Other     Current Outpatient Prescriptions  Medication Sig Dispense Refill  . aspirin 325 MG tablet Take 325 mg by mouth daily as needed for pain.       Marland Kitchen azaTHIOprine (IMURAN) 50 MG tablet Take 50 mg by mouth daily.        . Cyanocobalamin (VITAMIN B-12 PO) Take 1  tablet by mouth daily.        . mesalamine (ASACOL) 400 MG EC tablet Take 800 mg by mouth 3 (three) times daily.         No current facility-administered medications for this visit.   Past Surgical History  Procedure Laterality Date  . Abdominal surgery         Objective:   Physical Exam  Constitutional: He is oriented to person, place, and time. He appears well-developed and well-nourished. No distress.  HENT:  Head: Normocephalic and atraumatic.  Right Ear: External ear normal.  Left Ear: External ear normal.  Nose: Nose normal.  Mouth/Throat: Oropharynx is clear and moist. No oropharyngeal exudate.  Eyes: Conjunctivae and EOM are normal. Pupils are equal, round, and reactive to light. Right eye exhibits no discharge. Left eye exhibits no discharge.  Neck: Normal range of motion. Neck supple. No JVD present. No thyromegaly present.  Cardiovascular: Normal rate, regular rhythm and intact distal pulses.  Exam reveals no gallop and no friction rub.   No murmur heard. Pulmonary/Chest: Effort normal and breath sounds normal. No respiratory distress. He has no wheezes. He has no rales. He exhibits no tenderness.  Abdominal: Soft. Bowel sounds are normal. He exhibits no distension and no mass. There is no tenderness. There is no rebound and no guarding.  Genitourinary: Rectum normal, prostate normal and penis normal. Guaiac negative stool.  Musculoskeletal: Normal range of  motion. He exhibits no edema and no tenderness.  Lymphadenopathy:    He has no cervical adenopathy.  Neurological: He is alert and oriented to person, place, and time. He displays normal reflexes. He exhibits normal muscle tone.  Skin: Skin is warm and dry. No rash noted. He is not diaphoretic. No erythema. No pallor.  Psychiatric: He has a normal mood and affect. His behavior is normal. Judgment and thought content normal.          Assessment & Plan:  1. Preventative health care Check labs See AVS Flu shot  denied  - Basic metabolic panel - CBC with Differential - Hepatic function panel - Lipid panel - POCT urinalysis dipstick - PSA - TSH 2 elevated bp--- recheck 2 weeks     Dec salt 3.  tob abuse--  Pt is trying to cut back

## 2014-04-21 NOTE — Progress Notes (Signed)
Pre visit review using our clinic review tool, if applicable. No additional management support is needed unless otherwise documented below in the visit note. 

## 2014-05-05 ENCOUNTER — Encounter: Payer: Self-pay | Admitting: Family Medicine

## 2014-05-05 ENCOUNTER — Ambulatory Visit (INDEPENDENT_AMBULATORY_CARE_PROVIDER_SITE_OTHER): Payer: BC Managed Care – PPO | Admitting: Family Medicine

## 2014-05-05 VITALS — BP 140/90 | HR 59 | Temp 98.3°F | Wt 194.2 lb

## 2014-05-05 DIAGNOSIS — I10 Essential (primary) hypertension: Secondary | ICD-10-CM

## 2014-05-05 DIAGNOSIS — E785 Hyperlipidemia, unspecified: Secondary | ICD-10-CM

## 2014-05-05 LAB — BASIC METABOLIC PANEL
BUN: 7 mg/dL (ref 6–23)
CALCIUM: 9 mg/dL (ref 8.4–10.5)
CHLORIDE: 105 meq/L (ref 96–112)
CO2: 23 meq/L (ref 19–32)
Creat: 0.94 mg/dL (ref 0.50–1.35)
Glucose, Bld: 79 mg/dL (ref 70–99)
Potassium: 3.4 mEq/L — ABNORMAL LOW (ref 3.5–5.3)
SODIUM: 140 meq/L (ref 135–145)

## 2014-05-05 MED ORDER — LISINOPRIL 10 MG PO TABS: 10.0000 mg | ORAL_TABLET | Freq: Every day | ORAL | Status: DC

## 2014-05-05 NOTE — Patient Instructions (Signed)

## 2014-05-05 NOTE — Progress Notes (Signed)
  Subjective:    Patient here for follow-up of elevated blood pressure.  He is exercising and is adherent to a low-salt diet.  Blood pressure is not well controlled at home. Cardiac symptoms: none. Patient denies: chest pain, chest pressure/discomfort, claudication, dyspnea, exertional chest pressure/discomfort, fatigue, irregular heart beat, lower extremity edema, near-syncope, orthopnea, palpitations, paroxysmal nocturnal dyspnea, syncope and tachypnea. Cardiovascular risk factors: hypertension, male gender and sedentary lifestyle. Use of agents associated with hypertension: none. History of target organ damage: none.  The following portions of the patient's history were reviewed and updated as appropriate: allergies, current medications, past family history, past medical history, past social history, past surgical history and problem list.  Review of Systems Pertinent items are noted in HPI.     Objective:    BP 140/90  Pulse 59  Temp(Src) 98.3 F (36.8 C) (Oral)  Wt 194 lb 3.2 oz (88.089 kg)  SpO2 98% General appearance: alert, cooperative, appears stated age and no distress Neck: no carotid bruit, supple, symmetrical, trachea midline and thyroid not enlarged, symmetric, no tenderness/mass/nodules Lungs: clear to auscultation bilaterally Heart: S1, S2 normal Extremities: extremities normal, atraumatic, no cyanosis or edema    Assessment:    Hypertension, . Evidence of target organ damage: none.    Plan:    Medication: begin lisinopril. Dietary sodium restriction. Regular aerobic exercise. Follow up: 2 weeks and as needed.

## 2014-05-05 NOTE — Progress Notes (Signed)
Pre visit review using our clinic review tool, if applicable. No additional management support is needed unless otherwise documented below in the visit note. 

## 2014-05-08 ENCOUNTER — Telehealth: Payer: Self-pay | Admitting: Family Medicine

## 2014-05-08 NOTE — Telephone Encounter (Signed)
emmi emailed °

## 2014-05-26 ENCOUNTER — Ambulatory Visit: Payer: BC Managed Care – PPO | Admitting: Medical

## 2014-05-29 ENCOUNTER — Emergency Department (HOSPITAL_COMMUNITY): Payer: BC Managed Care – PPO

## 2014-05-29 ENCOUNTER — Encounter (HOSPITAL_COMMUNITY): Payer: Self-pay | Admitting: Emergency Medicine

## 2014-05-29 ENCOUNTER — Emergency Department (HOSPITAL_COMMUNITY)
Admission: EM | Admit: 2014-05-29 | Discharge: 2014-05-29 | Disposition: A | Discharge status: Home / Self Care | Payer: BC Managed Care – PPO | Attending: Emergency Medicine | Admitting: Emergency Medicine

## 2014-05-29 DIAGNOSIS — Z7982 Long term (current) use of aspirin: Secondary | ICD-10-CM | POA: Diagnosis not present

## 2014-05-29 DIAGNOSIS — S8992XA Unspecified injury of left lower leg, initial encounter: Secondary | ICD-10-CM | POA: Insufficient documentation

## 2014-05-29 DIAGNOSIS — K5090 Crohn's disease, unspecified, without complications: Secondary | ICD-10-CM | POA: Diagnosis not present

## 2014-05-29 DIAGNOSIS — S29012A Strain of muscle and tendon of back wall of thorax, initial encounter: Secondary | ICD-10-CM | POA: Insufficient documentation

## 2014-05-29 DIAGNOSIS — S161XXA Strain of muscle, fascia and tendon at neck level, initial encounter: Secondary | ICD-10-CM | POA: Diagnosis not present

## 2014-05-29 DIAGNOSIS — Y9389 Activity, other specified: Secondary | ICD-10-CM | POA: Insufficient documentation

## 2014-05-29 DIAGNOSIS — Z720 Tobacco use: Secondary | ICD-10-CM | POA: Insufficient documentation

## 2014-05-29 DIAGNOSIS — Y92410 Unspecified street and highway as the place of occurrence of the external cause: Secondary | ICD-10-CM | POA: Insufficient documentation

## 2014-05-29 DIAGNOSIS — S29019A Strain of muscle and tendon of unspecified wall of thorax, initial encounter: Secondary | ICD-10-CM

## 2014-05-29 DIAGNOSIS — S3992XA Unspecified injury of lower back, initial encounter: Secondary | ICD-10-CM | POA: Insufficient documentation

## 2014-05-29 DIAGNOSIS — S8991XA Unspecified injury of right lower leg, initial encounter: Secondary | ICD-10-CM | POA: Diagnosis not present

## 2014-05-29 DIAGNOSIS — S199XXA Unspecified injury of neck, initial encounter: Secondary | ICD-10-CM | POA: Diagnosis present

## 2014-05-29 DIAGNOSIS — Z79899 Other long term (current) drug therapy: Secondary | ICD-10-CM | POA: Insufficient documentation

## 2014-05-29 MED ORDER — IBUPROFEN 800 MG PO TABS: 800.0000 mg | ORAL_TABLET | Freq: Three times a day (TID) | ORAL | Status: DC | PRN

## 2014-05-29 MED ORDER — CYCLOBENZAPRINE HCL 10 MG PO TABS: 10.0000 mg | ORAL_TABLET | Freq: Three times a day (TID) | ORAL | Status: DC | PRN

## 2014-05-29 MED ORDER — HYDROCODONE-ACETAMINOPHEN 5-325 MG PO TABS: 1.0000 | ORAL_TABLET | Freq: Four times a day (QID) | ORAL | Status: DC | PRN
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
## 2014-05-29 NOTE — Discharge Instructions (Signed)
Return here as needed. Follow up with your doctor. Ice and heat to the areas that are sore °

## 2014-05-29 NOTE — ED Notes (Signed)
Patient transported to X-ray 

## 2014-05-31 NOTE — ED Provider Notes (Signed)
Medical screening examination/treatment/procedure(s) were performed by non-physician practitioner and as supervising physician I was immediately available for consultation/collaboration.   EKG Interpretation None       Raeford Razor, MD 05/31/14 1119

## 2014-06-09 ENCOUNTER — Ambulatory Visit: Payer: BC Managed Care – PPO | Admitting: Family Medicine

## 2014-07-06 ENCOUNTER — Encounter: Payer: Self-pay | Admitting: Neurology

## 2014-07-06 ENCOUNTER — Ambulatory Visit: Payer: BC Managed Care – PPO | Admitting: Neurology

## 2014-09-19 ENCOUNTER — Telehealth: Payer: Self-pay | Admitting: Family Medicine

## 2014-09-19 DIAGNOSIS — I10 Essential (primary) hypertension: Secondary | ICD-10-CM

## 2014-09-19 MED ORDER — LISINOPRIL 10 MG PO TABS: 10.0000 mg | ORAL_TABLET | Freq: Every day | ORAL | Status: DC

## 2014-09-19 NOTE — Telephone Encounter (Signed)
Caller name: Irigoyen,Heather Relation to pt: spouse  Call back number: [phone] Pharmacy: [id number] - [place], [place] - [address] AT [facility] OF [address] & [facility] [address] [phone] (Phone) [phone] (Fax)         Reason for call:  Spouse requesting  A refill lisinopril (PRINIVIL,ZESTRIL) 10 MG tablet

## 2014-09-19 NOTE — Telephone Encounter (Signed)
Rx faxed.    KP 

## 2014-09-22 MED ORDER — LISINOPRIL 10 MG PO TABS: 10.0000 mg | ORAL_TABLET | Freq: Every day | ORAL | Status: DC

## 2014-09-22 NOTE — Addendum Note (Signed)
Addended by: Arnette Norris on: 09/22/2014 04:22 PM   Modules accepted: Orders

## 2014-09-22 NOTE — Telephone Encounter (Signed)
Spoke with wife who requested [facility]. Rx faxed again.     KP

## 2014-09-26 ENCOUNTER — Other Ambulatory Visit: Payer: Self-pay

## 2014-09-26 DIAGNOSIS — I10 Essential (primary) hypertension: Secondary | ICD-10-CM

## 2014-09-26 MED ORDER — LISINOPRIL 10 MG PO TABS: 10.0000 mg | ORAL_TABLET | Freq: Every day | ORAL | Status: DC

## 2014-12-08 ENCOUNTER — Other Ambulatory Visit (HOSPITAL_COMMUNITY): Payer: Self-pay | Admitting: Gastroenterology

## 2015-01-03 ENCOUNTER — Telehealth: Payer: Self-pay | Admitting: Family Medicine

## 2015-01-03 DIAGNOSIS — I10 Essential (primary) hypertension: Secondary | ICD-10-CM

## 2015-01-03 MED ORDER — LISINOPRIL 10 MG PO TABS: 10.0000 mg | ORAL_TABLET | Freq: Every day | ORAL | Status: DC

## 2015-01-03 NOTE — Telephone Encounter (Signed)
Pt scheduled follow up for 01/30/2015

## 2015-01-03 NOTE — Telephone Encounter (Signed)
Caller Name:  Dawn,Heather Relation to pt: spouse  Call back number: [phone] Pharmacy: [id number] - [place], [place] - [address] AT [facility] [address] [phone] (Phone) [phone] (Fax)         Reason for call:  Spouse requesting a refill lisinopril (PRINIVIL,ZESTRIL) 10 MG tablet

## 2015-01-03 NOTE — Telephone Encounter (Signed)
He needs an OV, please schedule.     KP

## 2015-01-30 ENCOUNTER — Encounter: Payer: Self-pay | Admitting: Family Medicine

## 2015-01-30 ENCOUNTER — Ambulatory Visit (INDEPENDENT_AMBULATORY_CARE_PROVIDER_SITE_OTHER): Payer: BLUE CROSS/BLUE SHIELD | Admitting: Family Medicine

## 2015-01-30 VITALS — BP 141/90 | HR 67 | Temp 93.0°F | Wt 188.0 lb

## 2015-01-30 DIAGNOSIS — I10 Essential (primary) hypertension: Secondary | ICD-10-CM

## 2015-01-30 MED ORDER — LISINOPRIL 20 MG PO TABS: 20.0000 mg | ORAL_TABLET | Freq: Every day | ORAL | Status: DC

## 2015-01-30 NOTE — Patient Instructions (Signed)

## 2015-01-30 NOTE — Progress Notes (Signed)
Pre visit review using our clinic review tool, if applicable. No additional management support is needed unless otherwise documented below in the visit note. 
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
## 2015-02-08 ENCOUNTER — Telehealth: Payer: Self-pay | Admitting: Family Medicine

## 2015-02-08 NOTE — Telephone Encounter (Signed)
Spoke with wife and his BP was 176/114 and then it was 131/86 pulse 55. He has bee dizzy since increasing his medication, I advised per Dr.Lowne cut the pill in half and follow up in the office. Apt scheduled 02/13/15 @ 4 pm

## 2015-02-08 NOTE — Telephone Encounter (Signed)
Caller name: Vandervoort,Heather Relation to pt: spouse  Call back [phone] Pharmacy:  [id number] - [place], [place] - [address] AT [address] [phone] (Phone) [phone] (Fax)         Reason for call:   AS per spouse requesting decrease in MG of lisinopril (PRINIVIL,ZESTRIL. Pt experiencing dizziness and spouse states MG is to strong. Last OV 01/30/2015

## 2015-02-08 NOTE — Telephone Encounter (Signed)
Cut in 1/2 with pill cutter and make ov to come in so we can check it

## 2015-02-08 NOTE — Telephone Encounter (Signed)
Please advise      KP 

## 2015-02-13 ENCOUNTER — Ambulatory Visit: Payer: BLUE CROSS/BLUE SHIELD | Admitting: Family Medicine

## 2015-05-04 ENCOUNTER — Encounter: Payer: BLUE CROSS/BLUE SHIELD | Admitting: Family Medicine

## 2015-08-24 ENCOUNTER — Encounter: Payer: BLUE CROSS/BLUE SHIELD | Admitting: Family Medicine

## 2015-08-24 ENCOUNTER — Telehealth: Payer: Self-pay | Admitting: Family Medicine

## 2015-08-27 NOTE — Telephone Encounter (Signed)
Pt was no show 08/24/15 3:30pm for cpe, pt has not rescheduled, charge or no charge?

## 2015-08-27 NOTE — Telephone Encounter (Signed)
charge 

## 2015-08-28 ENCOUNTER — Encounter: Payer: Self-pay | Admitting: Family Medicine

## 2015-08-28 NOTE — Telephone Encounter (Signed)
Marked to charge, mailing letter °

## 2016-02-01 ENCOUNTER — Other Ambulatory Visit: Payer: Self-pay | Admitting: Family Medicine

## 2016-02-01 NOTE — Telephone Encounter (Signed)
Would you call the patient to scheduled his CPE.     Reginald Johnson

## 2016-05-20 ENCOUNTER — Ambulatory Visit: Payer: 59 | Admitting: Podiatry

## 2016-06-12 ENCOUNTER — Ambulatory Visit (INDEPENDENT_AMBULATORY_CARE_PROVIDER_SITE_OTHER): Payer: 59 | Admitting: Podiatry

## 2016-06-12 ENCOUNTER — Other Ambulatory Visit: Payer: Self-pay | Admitting: Family Medicine

## 2016-06-12 ENCOUNTER — Encounter: Payer: Self-pay | Admitting: Podiatry

## 2016-06-12 ENCOUNTER — Ambulatory Visit (INDEPENDENT_AMBULATORY_CARE_PROVIDER_SITE_OTHER): Payer: 59

## 2016-06-12 VITALS — BP 97/71 | HR 78 | Resp 16 | Ht 71.0 in | Wt 196.0 lb

## 2016-06-12 DIAGNOSIS — M779 Enthesopathy, unspecified: Secondary | ICD-10-CM | POA: Diagnosis not present

## 2016-06-12 DIAGNOSIS — M79671 Pain in right foot: Secondary | ICD-10-CM

## 2016-06-12 DIAGNOSIS — Q828 Other specified congenital malformations of skin: Secondary | ICD-10-CM | POA: Diagnosis not present

## 2016-06-12 MED ORDER — TRIAMCINOLONE ACETONIDE 10 MG/ML IJ SUSP
10.0000 mg | Freq: Once | INTRAMUSCULAR | Status: AC
  Administered 2016-06-12: 10 mg

## 2016-06-12 NOTE — Progress Notes (Signed)
Subjective:     Patient ID: Reginald Johnson, male   DOB: 07-Nov-1970, 45 y.o.   MRN: [id number]  HPI patient presents with lesion on the plantar aspect of the right foot lateral side that's tender with thick type tissue formation and states it's been there for a long time and he has to trim it out about every month which only gives him short-term relief   Review of Systems  All other systems reviewed and are negative.      Objective:   Physical Exam  Constitutional: He is oriented to person, place, and time.  Cardiovascular: Intact distal pulses.   Musculoskeletal: Normal range of motion.  Neurological: He is oriented to person, place, and time.  Skin: Skin is warm.  Nursing note and vitals reviewed.  neurovascular status intact muscle strength adequate range of motion within normal limits with patient noted to have discomfort in the plantar aspect of the right foot lateral side with keratotic tissue formation fluid buildup and irritation of the underlying capsule. It is localized to this area with patient noted to have good digital perfusion and well oriented 3     Assessment:     Inflammatory capsulitis fifth base right with keratotic tissue formation    Plan:     H&P x-rays reviewed and I did inject the plantar lateral capsule 3 mg Kenalog 5 mg Xylocaine and debrided the lesion fully and applied salicylic acid to it. Applied compression reappoint for us to recheck again when symptomatic and may require more aggressive treatment depending on response  X-ray report indicated that there is no indications of bone pathology and strictly appears to be soft tissue at this time

## 2016-06-12 NOTE — Progress Notes (Signed)
   Subjective:    Patient ID: Reginald Johnson, male    DOB: Nov 17, 1970, 45 y.o.   MRN: [id number]  HPI Chief Complaint  Patient presents with  . Foot Pain    Right foot; lateral side; pt stated, "Sometimes skin comes out in a spiral shape; happens once a month"; x1 yr      Review of Systems  Musculoskeletal: Positive for gait problem and myalgias.  All other systems reviewed and are negative.      Objective:   Physical Exam        Assessment & Plan:

## 2017-09-11 ENCOUNTER — Encounter: Payer: BLUE CROSS/BLUE SHIELD | Admitting: Family Medicine

## 2018-01-12 ENCOUNTER — Ambulatory Visit: Payer: BLUE CROSS/BLUE SHIELD | Admitting: Family Medicine

## 2018-01-12 DIAGNOSIS — Z0289 Encounter for other administrative examinations: Secondary | ICD-10-CM

## 2018-01-13 ENCOUNTER — Encounter: Payer: Self-pay | Admitting: Family Medicine

## 2019-08-09 DIAGNOSIS — K5080 Crohn's disease of both small and large intestine without complications: Secondary | ICD-10-CM | POA: Diagnosis not present

## 2019-08-16 DIAGNOSIS — K5080 Crohn's disease of both small and large intestine without complications: Secondary | ICD-10-CM | POA: Diagnosis not present

## 2019-10-13 ENCOUNTER — Ambulatory Visit: Payer: Self-pay | Attending: Internal Medicine

## 2019-11-03 ENCOUNTER — Ambulatory Visit: Payer: Self-pay

## 2019-11-11 ENCOUNTER — Ambulatory Visit: Payer: Self-pay

## 2019-12-22 ENCOUNTER — Ambulatory Visit: Payer: Self-pay

## 2020-01-26 ENCOUNTER — Ambulatory Visit: Payer: Self-pay

## 2020-02-23 ENCOUNTER — Ambulatory Visit: Payer: Self-pay

## 2020-02-23 NOTE — Progress Notes (Signed)
   Covid-19 Vaccination Clinic  Name:  Damani Kelemen    MRN: [id number] DOB: July 31, 1971  02/23/2020  Mr. Zeigler was observed post Covid-19 immunization for 15 minutes without incident. He was provided with Vaccine Information Sheet and instruction to access the V-Safe system.   Mr. Zea was instructed to call 911 with any severe reactions post vaccine: Marland Kitchen Difficulty breathing  . Swelling of face and throat  . A fast heartbeat  . A bad rash all over body  . Dizziness and weakness

## 2020-08-02 ENCOUNTER — Ambulatory Visit: Payer: Self-pay

## 2020-08-14 DIAGNOSIS — K5080 Crohn's disease of both small and large intestine without complications: Secondary | ICD-10-CM | POA: Diagnosis not present

## 2021-01-18 ENCOUNTER — Other Ambulatory Visit: Payer: Self-pay

## 2021-01-18 ENCOUNTER — Ambulatory Visit (HOSPITAL_COMMUNITY)
Admission: EM | Admit: 2021-01-18 | Discharge: 2021-01-18 | Disposition: A | Discharge status: Home / Self Care | Payer: BC Managed Care – PPO | Attending: Family | Admitting: Family

## 2021-01-18 ENCOUNTER — Encounter (HOSPITAL_COMMUNITY): Payer: Self-pay

## 2021-01-18 DIAGNOSIS — M5441 Lumbago with sciatica, right side: Secondary | ICD-10-CM | POA: Diagnosis not present

## 2021-01-18 DIAGNOSIS — M62830 Muscle spasm of back: Secondary | ICD-10-CM | POA: Diagnosis not present

## 2021-01-18 MED ORDER — PREDNISONE 10 MG (21) PO TBPK: ORAL_TABLET | Freq: Every day | ORAL | 0 refills | Status: DC

## 2021-01-18 MED ORDER — CYCLOBENZAPRINE HCL 10 MG PO TABS: ORAL_TABLET | ORAL | 0 refills | Status: DC

## 2021-01-18 NOTE — ED Provider Notes (Signed)
[facility]    CSN: [id number] Arrival date & time: 01/18/21  1306      History   Chief Complaint Chief Complaint  Patient presents with   Back Pain   Hip Pain   Leg Pain    HPI Reginald Johnson is a 50 y.o. male.   50 year old male presents with lower back pain and bilateral hip pain that started over 1 week ago. His father-in-law started to fall and he grabbed him and prevented him from falling but twisted his back at the time. He felt some immediate pain but pain has gotten worse with muscle spasms and now pain is radiating down right leg for the past 3 days. He denies any shortness of breath, abdominal pain, nausea, vomiting, constipation, change in bladder or bowel, dysuria, or numbness. He has tried Biofreeze, Bengay, Aleve and hot/cold wraps with no relief. Has occasional back pain in the past but usually responds to OTC medications. Other chronic issues include Crohn's disease and currently on Imuran, Lialda and vit B12 daily.   The history is provided by the patient.   Past Medical History:  Diagnosis Date   Crohn disease (HCC)    Crohn's     Patient Active Problem List   Diagnosis Date Noted   Chest pain 01/08/2013   Tobacco abuse 01/08/2013   COLITIS 08/15/2005    Past Surgical History:  Procedure Laterality Date   ABDOMINAL SURGERY         Home Medications    Prior to Admission medications   Medication Sig Start Date End Date Taking? Authorizing Provider  azaTHIOprine (IMURAN) 50 MG tablet Take 50 mg by mouth daily.     Yes [provider]  Cyanocobalamin (VITAMIN B-12 PO) Take 1 tablet by mouth daily.     Yes [provider]  cyclobenzaprine (FLEXERIL) 10 MG tablet Take 1/2 to 1 whole tablet by mouth every 8 hours as needed for muscle spasms/pain. 01/18/21  Yes Calianne Larue, Ali Lowe, NP  LIALDA 1.2 G EC tablet Take 4 tablets by mouth daily. 01/17/15  Yes [provider]  predniSONE (STERAPRED UNI-PAK 21 TAB) 10 MG (21) TBPK  tablet Take by mouth daily. Take 6 tabs by mouth daily  for 2 days, then 5 tabs for 2 days, then 4 tabs for 2 days, then 3 tabs for 2 days, 2 tabs for 2 days, then 1 tab by mouth daily for 2 days 01/18/21  Yes Sam Overbeck, Ali Lowe, NP    Family History Family History  Problem Relation Age of Onset   Hypertension Mother    Breast cancer Other    Diabetes Mellitus II Other     Social History Social History   Tobacco Use   Smoking status: Some Days    Years: 15.00    Pack years: 0.00    Types: Cigarettes   Tobacco comments:    10 cigarettes daily  Substance Use Topics   Alcohol use: No   Drug use: No     Allergies   Patient has no known allergies.   Review of Systems Review of Systems  Constitutional:  Positive for activity change. Negative for appetite change, chills, diaphoresis, fatigue and fever.  HENT:  Negative for facial swelling and trouble swallowing.   Eyes:  Negative for photophobia and visual disturbance.  Respiratory:  Negative for chest tightness, shortness of breath and wheezing.   Gastrointestinal:  Negative for nausea and vomiting.  Genitourinary:  Negative for decreased urine volume,  difficulty urinating, dysuria, flank pain and hematuria.  Musculoskeletal:  Positive for arthralgias, back pain and myalgias. Negative for gait problem.  Skin:  Negative for color change and rash.  Allergic/Immunologic: Positive for immunocompromised state. Negative for environmental allergies and food allergies.  Neurological:  Negative for dizziness, tremors, seizures, syncope, facial asymmetry, speech difficulty, weakness, light-headedness, numbness and headaches.  Hematological:  Negative for adenopathy. Does not bruise/bleed easily.    Physical Exam Triage Vital Signs ED Triage Vitals  Enc Vitals Group     BP 01/18/21 1417 127/80     Pulse Rate 01/18/21 1417 62     Resp 01/18/21 1417 18     Temp 01/18/21 1417 98 F (36.7 C)     Temp Source 01/18/21 1417 Oral     SpO2  01/18/21 1417 97 %     Weight --      Height --      Head Circumference --      Peak Flow --      Pain Score 01/18/21 1408 8     Pain Loc --      Pain Edu? --      Excl. in GC? --    No data found.  Updated Vital Signs BP 127/80   Pulse 62   Temp 98 F (36.7 C) (Oral)   Resp 18   SpO2 97%   Visual Acuity Right Eye Distance:   Left Eye Distance:   Bilateral Distance:    Right Eye Near:   Left Eye Near:    Bilateral Near:     Physical Exam Vitals and nursing note reviewed.  Constitutional:      General: He is awake. He is not in acute distress.    Appearance: He is well-developed and well-groomed.     Comments: Pleasant young man sitting on the exam table in no acute distress but appears uncomfortable due to pain, particularly when changing positions.   HENT:     Head: Normocephalic and atraumatic.     Right Ear: Hearing normal.     Left Ear: Hearing normal.  Eyes:     Extraocular Movements: Extraocular movements intact.     Conjunctiva/sclera: Conjunctivae normal.  Cardiovascular:     Rate and Rhythm: Normal rate and regular rhythm.     Heart sounds: Normal heart sounds. No murmur heard. Pulmonary:     Effort: Pulmonary effort is normal. No respiratory distress.     Breath sounds: Normal breath sounds and air entry. No decreased air movement. No decreased breath sounds, wheezing, rhonchi or rales.  Abdominal:     Tenderness: There is no right CVA tenderness or left CVA tenderness.  Musculoskeletal:        General: Tenderness present. No swelling.     Cervical back: Normal and normal range of motion.     Thoracic back: Normal.     Lumbar back: Spasms and tenderness present. No swelling, edema or signs of trauma. Decreased range of motion. Positive right straight leg raise test.       Back:     Comments: Has decreased range of motion of lower lumbar area, particularly with flexion and rotation. Tender along central lower lumbar area with muscle spasms felt  bilaterally. No redness, warmth or rash. No neuro deficits noted. Good distal pulses. No extremity edema.   Skin:    General: Skin is warm and dry.     Capillary Refill: Capillary refill takes less than 2 seconds.     Findings:  No abscess, bruising, ecchymosis, erythema, signs of injury or rash.  Neurological:     General: No focal deficit present.     Mental Status: He is alert and oriented to person, place, and time.     Sensory: Sensation is intact. No sensory deficit.     Motor: Motor function is intact.     Gait: Gait is intact.     Deep Tendon Reflexes: Reflexes are normal and symmetric.  Psychiatric:        Mood and Affect: Mood normal.        Behavior: Behavior normal. Behavior is cooperative.        Thought Content: Thought content normal.        Judgment: Judgment normal.     UC Treatments / Results  Labs (all labs ordered are listed, but only abnormal results are displayed) Labs Reviewed - No data to display  EKG   Radiology No results found.  Procedures Procedures (including critical care time)  Medications Ordered in UC Medications - No data to display  Initial Impression / Assessment and Plan / UC Course  I have reviewed the triage vital signs and the nursing notes.  Pertinent labs & imaging results that were available during my care of the patient were reviewed by me and considered in my medical decision making (see chart for details).    Reviewed with patient that he probably has a lumbar muscle strain with nerve irritation and mild sciatica. Also experiencing palpable muscle spasms. Do not feel imaging is needed at this time. Will start Prednisone 10mg  12 day dose pack as directed. Take Flexeril 10mg  - 1/2 to 1 whole tablet every 8 hours as needed- may cause drowsiness. Continue to apply warm compresses to area for comfort. Follow-up here in 3 to 4 days if not improving.    Final Clinical Impressions(s) / UC Diagnoses   Final diagnoses:  Acute midline  low back pain with right-sided sciatica  Muscle spasm of back     Discharge Instructions      Recommend start Prednisone 10mg  - take 6 tablets today and tomorrow and then decrease by 1 tablet every 2 days until finished on day 12- take with food. May take Flexeril 10mg  muscle relaxer- take 1/2 to 1 whole tablet every 8 hours as needed for muscle spasms- can cause drowsiness. Continue to apply warm compress to lower back area. Follow-up here in 3 to 4 days if not improving.      ED Prescriptions     Medication Sig Dispense Auth. Provider   predniSONE (STERAPRED UNI-PAK 21 TAB) 10 MG (21) TBPK tablet Take by mouth daily. Take 6 tabs by mouth daily  for 2 days, then 5 tabs for 2 days, then 4 tabs for 2 days, then 3 tabs for 2 days, 2 tabs for 2 days, then 1 tab by mouth daily for 2 days 42 tablet Sherene Plancarte, , NP   cyclobenzaprine (FLEXERIL) 10 MG tablet Take 1/2 to 1 whole tablet by mouth every 8 hours as needed for muscle spasms/pain. 15 tablet Amanat Hackel, , NP      PDMP not reviewed this encounter.   , NP 01/19/21 1341

## 2021-01-18 NOTE — Discharge Instructions (Addendum)
Recommend start Prednisone 10mg  - take 6 tablets today and tomorrow and then decrease by 1 tablet every 2 days until finished on day 12- take with food. May take Flexeril 10mg  muscle relaxer- take 1/2 to 1 whole tablet every 8 hours as needed for muscle spasms- can cause drowsiness. Continue to apply warm compress to lower back area. Follow-up here in 3 to 4 days if not improving.

## 2021-01-18 NOTE — ED Triage Notes (Signed)
Pt reports low back pain, bilateral hip pain and right leg pain since last Wednesday.   Reports at that time he injured himself while grabbing father in law to prevent him from falling. Reported when this occurred it caused an awkward twisting motion in his back and has had pain ever since.  Pt has tried biofreeze, bengay, other otc treatments without relief. Has also tried hot/cold band without relief.

## 2021-02-01 ENCOUNTER — Telehealth: Payer: Self-pay

## 2021-02-01 NOTE — Telephone Encounter (Signed)
Pts wife called because pt was seen at [facility] on 01/18/21.   He has not been seen in a number of years and needs to re-establish, are you okay with this?  Also in the mean time of him having an appointment with [place] [facility] Seta asks for any suggestions on additional tx to the ones given at [facility]. He has completed the Prednisone now and is still in pain.   CB: [phone]

## 2021-02-01 NOTE — Telephone Encounter (Signed)
Pts wife is aware- appointment scheduled.

## 2021-02-02 ENCOUNTER — Ambulatory Visit (HOSPITAL_COMMUNITY): Payer: BC Managed Care – PPO

## 2021-02-02 ENCOUNTER — Ambulatory Visit (HOSPITAL_COMMUNITY)
Admission: EM | Admit: 2021-02-02 | Discharge: 2021-02-02 | Disposition: A | Discharge status: Home / Self Care | Payer: BC Managed Care – PPO | Attending: Family Medicine | Admitting: Family Medicine

## 2021-02-02 ENCOUNTER — Other Ambulatory Visit: Payer: Self-pay

## 2021-02-02 ENCOUNTER — Encounter (HOSPITAL_COMMUNITY): Payer: Self-pay | Admitting: Emergency Medicine

## 2021-02-02 DIAGNOSIS — M5441 Lumbago with sciatica, right side: Secondary | ICD-10-CM

## 2021-02-02 MED ORDER — PREDNISONE 10 MG (21) PO TBPK: ORAL_TABLET | Freq: Every day | ORAL | 0 refills | Status: AC

## 2021-02-02 MED ORDER — MELOXICAM 15 MG PO TABS: 15.0000 mg | ORAL_TABLET | Freq: Every day | ORAL | 0 refills | Status: DC

## 2021-02-02 NOTE — ED Triage Notes (Signed)
Pt is present today with recurrent back . Pt states that he finished the prednisone but after he finished the pain started shooting down his right leg.

## 2021-02-02 NOTE — ED Provider Notes (Signed)
[facility]   [id number] 02/02/21 Arrival Time: 1009  EV:OJJKK PAIN  SUBJECTIVE: History from: patient. Reginald Johnson is a 50 y.o. male complains of right low back pain that radiates down the right leg. Reports that he recently finished a steroid taper for the same. Denies a precipitating event or specific injury. Describes the pain as constant/ intermittent and achy in character with intermittent sharp pains with certain movements. Has tried OTC medications without relief.  Symptoms are made worse with activity. Reports similar symptoms in the past. Denies fever, chills, erythema, ecchymosis, effusion, weakness, numbness and tingling, saddle paresthesias, loss of bowel or bladder function.      ROS: As per HPI.  All other pertinent ROS negative.     Past Medical History:  Diagnosis Date   Crohn disease (HCC)    Crohn's    Past Surgical History:  Procedure Laterality Date   ABDOMINAL SURGERY     No Known Allergies No current facility-administered medications on file prior to encounter.   Current Outpatient Medications on File Prior to Encounter  Medication Sig Dispense Refill   azaTHIOprine (IMURAN) 50 MG tablet Take 50 mg by mouth daily.       Cyanocobalamin (VITAMIN B-12 PO) Take 1 tablet by mouth daily.       cyclobenzaprine (FLEXERIL) 10 MG tablet Take 1/2 to 1 whole tablet by mouth every 8 hours as needed for muscle spasms/pain. 15 tablet 0   LIALDA 1.2 G EC tablet Take 4 tablets by mouth daily.  6   Social History   Socioeconomic History   Marital status: Married    Spouse name: Not on file   Number of children: Not on file   Years of education: Not on file   Highest education level: Not on file  Occupational History   Not on file  Tobacco Use   Smoking status: Some Days    Years: 15.00    Pack years: 0.00    Types: Cigarettes   Smokeless tobacco: Not on file   Tobacco comments:    10 cigarettes daily  Substance and Sexual Activity   Alcohol use: No    Drug use: No   Sexual activity: Yes  Other Topics Concern   Not on file  Social History Narrative   Not on file   Social Determinants of Health   Financial Resource Strain: Not on file  Food Insecurity: Not on file  Transportation Needs: Not on file  Physical Activity: Not on file  Stress: Not on file  Social Connections: Not on file  Intimate Partner Violence: Not on file   Family History  Problem Relation Age of Onset   Hypertension Mother    Breast cancer Other    Diabetes Mellitus II Other     OBJECTIVE:  Vitals:   02/02/21 1036  BP: 107/74  Pulse: 89  Resp: 18  Temp: 98.7 F (37.1 C)  TempSrc: Oral  SpO2: 95%    General appearance: ALERT; in no acute distress.  Head: NCAT Lungs: Normal respiratory effort CV: pulses 2+ bilaterally. Cap refill < 2 seconds Musculoskeletal:  Inspection: Skin warm, dry, clear and intact No erythema, effusion noted Palpation: R low back tender to palpation, + R SLR ROM: Limited ROM active and passive to back with bending, twisting, changing positions Skin: warm and dry Neurologic: Ambulates without difficulty; Sensation intact about the upper/ lower extremities Psychological: alert and cooperative; normal mood and affect  DIAGNOSTIC STUDIES:  No results found.   ASSESSMENT &  PLAN:  1. Acute right-sided low back pain with right-sided sciatica    Meds ordered this encounter  Medications   meloxicam (MOBIC) 15 MG tablet    Sig: Take 1 tablet (15 mg total) by mouth daily.    Dispense:  30 tablet    Refill:  0    Order Specific Question:   Supervising Provider    Answer:   Merrilee Jansky [[id number]]   predniSONE (STERAPRED UNI-PAK 21 TAB) 10 MG (21) TBPK tablet    Sig: Take by mouth daily for 6 days. Take 6 tablets on day 1, 5 tablets on day 2, 4 tablets on day 3, 3 tablets on day 4, 2 tablets on day 5, 1 tablet on day 6    Dispense:  21 tablet    Refill:  0    Order Specific Question:   Supervising Provider    Answer:    Merrilee Jansky [[id number]]    Prescribed steroid taper Prescribed Meloxicam No ibuprofen with meloxicam, may take tylenol Continue home muscle relaxers Continue conservative management of rest, ice, and gentle stretches Take cyclobenzaprine at nighttime for symptomatic relief. Avoid driving or operating heavy machinery while using medication. Follow up with sports medicine if symptoms persist Return or go to the ER if you have any new or worsening symptoms (fever, chills, chest pain, abdominal pain, changes in bowel or bladder habits, pain radiating into lower legs)   Reviewed expectations re: course of current medical issues. Questions answered. Outlined signs and symptoms indicating need for more acute intervention. Patient verbalized understanding. After Visit Summary given.        Moshe Cipro, NP 02/02/21 1100

## 2021-02-02 NOTE — Discharge Instructions (Addendum)
I have sent in a prednisone taper for you to take for 6 days. 6 tablets on day one, 5 tablets on day two, 4 tablets on day three, 3 tablets on day four, 2 tablets on day five, and 1 tablet on day six.  I have prescribed Meloxicam for you to take daily. Do not take ibuprofen with this medication. You may take tylenol with this.  Follow up with sports medicine if symptoms are persisting

## 2021-02-05 ENCOUNTER — Other Ambulatory Visit: Payer: Self-pay

## 2021-02-05 ENCOUNTER — Ambulatory Visit (HOSPITAL_BASED_OUTPATIENT_CLINIC_OR_DEPARTMENT_OTHER)
Admission: RE | Admit: 2021-02-05 | Discharge: 2021-02-05 | Disposition: A | Discharge status: Home / Self Care | Payer: BC Managed Care – PPO | Source: Ambulatory Visit | Attending: Family Medicine | Admitting: Family Medicine

## 2021-02-05 ENCOUNTER — Ambulatory Visit: Payer: BC Managed Care – PPO | Admitting: Family Medicine

## 2021-02-05 ENCOUNTER — Encounter: Payer: Self-pay | Admitting: Family Medicine

## 2021-02-05 VITALS — BP 136/81 | HR 85 | Temp 98.2°F | Resp 16 | Ht 69.0 in | Wt 189.0 lb

## 2021-02-05 DIAGNOSIS — M545 Low back pain, unspecified: Secondary | ICD-10-CM | POA: Diagnosis not present

## 2021-02-05 DIAGNOSIS — M5441 Lumbago with sciatica, right side: Secondary | ICD-10-CM | POA: Insufficient documentation

## 2021-02-05 MED ORDER — TRAMADOL HCL 50 MG PO TABS: ORAL_TABLET | ORAL | 0 refills | Status: DC

## 2021-02-05 MED ORDER — TIZANIDINE HCL 4 MG PO TABS: 4.0000 mg | ORAL_TABLET | Freq: Four times a day (QID) | ORAL | 0 refills | Status: AC | PRN

## 2021-02-05 NOTE — Assessment & Plan Note (Signed)
Muscle relaxers pred taper  Xray to be done  Consider PT vs MRI

## 2021-02-05 NOTE — Progress Notes (Signed)
New Patient Office Visit  Subjective:  Patient ID: Reginald Johnson, male    DOB: [birth year]  Age: 50 y.o. MRN: [id number]  CC:  Chief Complaint  Patient presents with   New Patient (Initial Visit)    Here to re-establish care   Back Pain    Complains of low back pain radiating down the right leg    HPI Reginald Johnson presents for low back pain x 3 weeks after trying to catch his father -in -law ---- he went to er 2x and was put on steroids and muscle relaxer and meloxicam.   Pain is on the R side and radiates down R leg to shin.   He feels like his leg will give out on him    Past Medical History:  Diagnosis Date   Crohn disease (HCC)    Crohn's     Past Surgical History:  Procedure Laterality Date   ABDOMINAL SURGERY      Family History  Problem Relation Age of Onset   Hypertension Mother     Social History   Socioeconomic History   Marital status: Married    Spouse name: Research scientist (physical sciences)   Number of children: 3   Years of education: Not on file   Highest education level: Not on file  Occupational History   Not on file  Tobacco Use   Smoking status: Some Days    Years: 15.00    Pack years: 0.00    Types: Cigarettes   Smokeless tobacco: Not on file   Tobacco comments:    10 cigarettes daily  Vaping Use   Vaping Use: Never used  Substance and Sexual Activity   Alcohol use: No   Drug use: No   Sexual activity: Yes    Partners: Female  Other Topics Concern   Not on file  Social History Narrative   Not on file   Social Determinants of Health   Financial Resource Strain: Not on file  Food Insecurity: Not on file  Transportation Needs: Not on file  Physical Activity: Inactive   Days of Exercise per Week: 0 days   Minutes of Exercise per Session: 0 min  Stress: Not on file  Social Connections: Not on file  Intimate Partner Violence: Not on file    ROS Review of Systems  Constitutional: Negative.  Negative for appetite change, diaphoresis, fatigue and  unexpected weight change.  HENT:  Negative for congestion, ear pain, hearing loss, nosebleeds, postnasal drip, rhinorrhea, sinus pressure, sneezing and tinnitus.   Eyes:  Negative for photophobia, pain, discharge, redness, itching and visual disturbance.  Respiratory: Negative.  Negative for cough, chest tightness, shortness of breath and wheezing.   Cardiovascular: Negative.  Negative for chest pain, palpitations and leg swelling.  Gastrointestinal:  Negative for abdominal distention, abdominal pain, anal bleeding, blood in stool and constipation.  Endocrine: Negative.  Negative for cold intolerance, heat intolerance, polydipsia, polyphagia and polyuria.  Genitourinary: Negative.  Negative for difficulty urinating, dysuria and frequency.  Musculoskeletal:  Positive for back pain. Negative for gait problem.  Skin: Negative.   Allergic/Immunologic: Negative.   Neurological:  Negative for dizziness, weakness, light-headedness, numbness and headaches.  Psychiatric/Behavioral:  Negative for agitation, confusion, decreased concentration, dysphoric mood, sleep disturbance and suicidal ideas. The patient is not nervous/anxious.    Objective:   Today's Vitals: BP 136/81 (BP Location: Right Arm, Patient Position: Sitting, Cuff Size: Small)   Pulse 85   Temp 98.2 F (36.8 C) (Oral)   Resp 16  Ht 5\' 9"  (1.753 m)   Wt 189 lb (85.7 kg)   SpO2 100%   BMI 27.91 kg/m   Physical Exam Vitals and nursing note reviewed.  Constitutional:      Appearance: He is well-developed.  HENT:     Head: Normocephalic and atraumatic.  Eyes:     Pupils: Pupils are equal, round, and reactive to light.  Neck:     Thyroid: No thyromegaly.  Cardiovascular:     Rate and Rhythm: Normal rate and regular rhythm.     Heart sounds: No murmur heard. Pulmonary:     Effort: Pulmonary effort is normal. No respiratory distress.     Breath sounds: Normal breath sounds. No wheezing or rales.  Chest:     Chest wall: No  tenderness.  Musculoskeletal:        General: Tenderness present. No deformity.     Cervical back: Normal range of motion and neck supple.     Lumbar back: Spasms and tenderness present. Normal range of motion. Positive right straight leg raise test. Negative left straight leg raise test.     Right lower leg: No edema.     Left lower leg: No edema.  Skin:    General: Skin is warm and dry.  Neurological:     Mental Status: He is alert and oriented to person, place, and time.     Motor: No weakness.     Gait: Gait is intact.     Deep Tendon Reflexes: Reflexes are normal and symmetric.  Psychiatric:        Behavior: Behavior normal.        Thought Content: Thought content normal.        Judgment: Judgment normal.    Assessment & Plan:   Problem List Items Addressed This Visit   None Visit Diagnoses     Acute right-sided low back pain with right-sided sciatica    -  Primary   Relevant Medications   tiZANidine (ZANAFLEX) 4 MG tablet   traMADol (ULTRAM) 50 MG tablet   Other Relevant Orders   DG Lumbar Spine Complete       Outpatient Encounter Medications as of 02/05/2021  Medication Sig   azaTHIOprine (IMURAN) 50 MG tablet Take 50 mg by mouth daily.     Cyanocobalamin (VITAMIN B-12 PO) Take 1 tablet by mouth daily.     LIALDA 1.2 G EC tablet Take 4 tablets by mouth daily.   predniSONE (STERAPRED UNI-PAK 21 TAB) 10 MG (21) TBPK tablet Take by mouth daily for 6 days. Take 6 tablets on day 1, 5 tablets on day 2, 4 tablets on day 3, 3 tablets on day 4, 2 tablets on day 5, 1 tablet on day 6   tiZANidine (ZANAFLEX) 4 MG tablet Take 1 tablet (4 mg total) by mouth every 6 (six) hours as needed for muscle spasms.   traMADol (ULTRAM) 50 MG tablet 1-2 po q6h prn pain   [DISCONTINUED] meloxicam (MOBIC) 15 MG tablet Take 1 tablet (15 mg total) by mouth daily.   [DISCONTINUED] cyclobenzaprine (FLEXERIL) 10 MG tablet Take 1/2 to 1 whole tablet by mouth every 8 hours as needed for muscle  spasms/pain.   No facility-administered encounter medications on file as of 02/05/2021.    Follow-up: Return if symptoms worsen or fail to improve.   04/08/2021, DO

## 2021-02-05 NOTE — Patient Instructions (Signed)
Acute Back Pain, Adult Acute back pain is sudden and usually short-lived. It is often caused by an injury to the muscles and tissues in the back. The injury may result from: A muscle or ligament getting overstretched or torn (strained). Ligaments are tissues that connect bones to each other. Lifting something improperly can cause a back strain. Wear and tear (degeneration) of the spinal disks. Spinal disks are circular tissue that provide cushioning between the bones of the spine (vertebrae). Twisting motions, such as while playing sports or doing yard work. A hit to the back. Arthritis. You may have a physical exam, lab tests, and imaging tests to find the cause ofyour pain. Acute back pain usually goes away with rest and home care. Follow these instructions at home: Managing pain, stiffness, and swelling Treatment may include medicines for pain and inflammation that are taken by mouth or applied to the skin, prescription pain medicine, or muscle relaxants. Take over-the-counter and prescription medicines only as told by your health care provider. Your health care provider may recommend applying ice during the first 24-48 hours after your pain starts. To do this: Put ice in a plastic bag. Place a towel between your skin and the bag. Leave the ice on for 20 minutes, 2-3 times a day. If directed, apply heat to the affected area as often as told by your health care provider. Use the heat source that your health care provider recommends, such as a moist heat pack or a heating pad. Place a towel between your skin and the heat source. Leave the heat on for 20-30 minutes. Remove the heat if your skin turns bright red. This is especially important if you are unable to feel pain, heat, or cold. You have a greater risk of getting burned. Activity  Do not stay in bed. Staying in bed for more than 1-2 days can delay your recovery. Sit up and stand up straight. Avoid leaning forward when you sit or  hunching over when you stand. If you work at a desk, sit close to it so you do not need to lean over. Keep your chin tucked in. Keep your neck drawn back, and keep your elbows bent at a 90-degree angle (right angle). Sit high and close to the steering wheel when you drive. Add lower back (lumbar) support to your car seat, if needed. Take short walks on even surfaces as soon as you are able. Try to increase the length of time you walk each day. Do not sit, drive, or stand in one place for more than 30 minutes at a time. Sitting or standing for long periods of time can put stress on your back. Do not drive or use heavy machinery while taking prescription pain medicine. Use proper lifting techniques. When you bend and lift, use positions that put less stress on your back: Bend your knees. Keep the load close to your body. Avoid twisting. Exercise regularly as told by your health care provider. Exercising helps your back heal faster and helps prevent back injuries by keeping muscles strong and flexible. Work with a physical therapist to make a safe exercise program, as recommended by your health care provider. Do any exercises as told by your physical therapist.  Lifestyle Maintain a healthy weight. Extra weight puts stress on your back and makes it difficult to have good posture. Avoid activities or situations that make you feel anxious or stressed. Stress and anxiety increase muscle tension and can make back pain worse. Learn ways to manage   anxiety and stress, such as through exercise. General instructions Sleep on a firm mattress in a comfortable position. Try lying on your side with your knees slightly bent. If you lie on your back, put a pillow under your knees. Follow your treatment plan as told by your health care provider. This may include: Cognitive or behavioral therapy. Acupuncture or massage therapy. Meditation or yoga. Contact a health care provider if: You have pain that is not  relieved with rest or medicine. You have increasing pain going down into your legs or buttocks. Your pain does not improve after 2 weeks. You have pain at night. You lose weight without trying. You have a fever or chills. Get help right away if: You develop new bowel or bladder control problems. You have unusual weakness or numbness in your arms or legs. You develop nausea or vomiting. You develop abdominal pain. You feel faint. Summary Acute back pain is sudden and usually short-lived. Use proper lifting techniques. When you bend and lift, use positions that put less stress on your back. Take over-the-counter and prescription medicines and apply heat or ice as directed by your health care provider. This information is not intended to replace advice given to you by your health care provider. Make sure you discuss any questions you have with your healthcare provider. Document Revised: 04/10/2020 Document Reviewed: 04/13/2020 Elsevier Patient Education  2022 [employer]  

## 2021-02-12 ENCOUNTER — Telehealth: Payer: Self-pay | Admitting: *Deleted

## 2021-02-12 NOTE — Telephone Encounter (Signed)
DOD: Pt seen on 02/05/21 for this concern and was given Tramadol and Tizanidine. Pt states worsening pain and missing work. Another OV? Please advise

## 2021-02-12 NOTE — Telephone Encounter (Signed)
Call Type Triage / Clinical Relationship To Patient Self Return Phone Number [phone] (Primary) Chief Complaint Leg Pain Reason for Call Symptomatic / Request for Health Information Initial Comment Caller states he went to see doctor for his right has sciatica got muscle relaxer however pain is still present and gotten worse Translation No Nurse Assessment Nurse: [name], RN, [name] Date/Time Reginald Johnson Time): 02/11/2021 5:24:49 PM Confirm and document reason for call. If symptomatic, describe symptoms. ---Caller states he went to see doctor for his right has sciatica got muscle relaxer however pain is still present and gotten worse. Pain is nearly 10/10. States every time he walks hip and lower leg is extremely painful, had to miss work today. It started in his back but now is right leg   Disp. Time Reginald Johnson Time) Disposition Final User 02/11/2021 5:29:22 PM Go to ED Now Yes [name], RN, [name]

## 2021-02-12 NOTE — Telephone Encounter (Signed)
Sched visit please. Ty.

## 2021-02-13 ENCOUNTER — Ambulatory Visit: Payer: BC Managed Care – PPO | Admitting: Family Medicine

## 2021-02-15 ENCOUNTER — Encounter: Payer: Self-pay | Admitting: Family Medicine

## 2021-02-15 ENCOUNTER — Ambulatory Visit: Payer: BC Managed Care – PPO | Admitting: Family Medicine

## 2021-02-15 ENCOUNTER — Other Ambulatory Visit: Payer: Self-pay

## 2021-02-15 ENCOUNTER — Other Ambulatory Visit: Payer: Self-pay | Admitting: Family Medicine

## 2021-02-15 VITALS — BP 138/82 | HR 64 | Temp 98.0°F | Ht 69.0 in | Wt 190.4 lb

## 2021-02-15 DIAGNOSIS — M7061 Trochanteric bursitis, right hip: Secondary | ICD-10-CM | POA: Diagnosis not present

## 2021-02-15 DIAGNOSIS — M7631 Iliotibial band syndrome, right leg: Secondary | ICD-10-CM | POA: Diagnosis not present

## 2021-02-15 MED ORDER — MELOXICAM 15 MG PO TABS: 15.0000 mg | ORAL_TABLET | Freq: Every day | ORAL | 0 refills | Status: AC

## 2021-02-15 MED ORDER — METHYLPREDNISOLONE ACETATE 40 MG/ML IJ SUSP
40.0000 mg | Freq: Once | INTRAMUSCULAR | Status: AC
  Administered 2021-02-15: 40 mg via INTRA_ARTICULAR

## 2021-02-15 NOTE — Patient Instructions (Addendum)
Ice/cold pack over area for 10-15 min twice daily.  Heat (pad or rice pillow in microwave) over affected area, 10-15 minutes twice daily.   OK to take Tylenol 1000 mg (2 extra strength tabs) or 975 mg (3 regular strength tabs) every 6 hours as needed.  Send me a message in a 3-4 weeks if no improvement and we can set you up with physical therapy or the sports medicine team.   Let us know if you need anything.  Iliotibial Band Syndrome Rehab It is normal to feel mild stretching, pulling, tightness, or discomfort as you do these exercises, but you should stop right away if you feel sudden pain or your pain gets worse.  Stretching and range of motion exercises These exercises warm up your muscles and joints and improve the movement and flexibility of your hip and pelvis. Exercise A: Quadriceps, prone    Lie on your abdomen on a firm surface, such as a bed or padded floor. Bend your left / right knee and hold your ankle. If you cannot reach your ankle or pant leg, loop a belt around your foot and grab the belt instead. Gently pull your heel toward your buttocks. Your knee should not slide out to the side. You should feel a stretch in the front of your thigh and knee. Hold this position for 30 seconds. Repeat 2 times. Complete this stretch 3 times per week. Exercise B: Iliotibial band    Lie on your side with your left / right leg in the top position. Bend both of your knees and grab your left / right ankle. Stretch out your bottom arm to help you balance. Slowly bring your top knee back so your thigh goes behind your trunk. Slowly lower your top leg toward the floor until you feel a gentle stretch on the outside of your left / right hip and thigh. If you do not feel a stretch and your knee will not fall farther, place the heel of your other foot on top of your knee and pull your knee down toward the floor with your foot. Hold this position for 30 seconds. Repeat 2 times. Complete this  stretch 3 times per week. Strengthening exercises These exercises build strength and endurance in your hip and pelvis. Endurance is the ability to use your muscles for a long time, even after they get tired. Exercise C: Straight leg raises (hip abductors)     Lie on your side with your left / right leg in the top position. Lie so your head, shoulder, knee, and hip line up. You may bend your bottom knee to help you balance. Roll your hips slightly forward so your hips are stacked directly over each other and your left / right knee is facing forward. Tense the muscles in your outer thigh and lift your top leg 4-6 inches (10-15 cm). Hold this position for 3 seconds. Repeat for a total of 10 reps. Slowly return to the starting position. Let your muscles relax completely before doing another repetition. Repeat 2 times. Complete this exercise 3 times per week. Exercise D: Straight leg raises (hip extensors) Lie on your abdomen on your bed or a firm surface. You can put a pillow under your hips if that is more comfortable. Bend your left / right knee so your foot is straight up in the air. Squeeze your buttock muscles and lift your left / right thigh off the bed. Do not let your back arch. Tense this muscle as hard  as you can without increasing any knee pain. Hold this position for 2 seconds. Repeat for a total of 10 reps Slowly lower your leg to the starting position and allow it to relax completely. Repeat 2 times. Complete this exercise 3 times per week. Exercise E: Hip hike Stand sideways on a bottom step. Stand on your left / right leg with your other foot unsupported next to the step. You can hold onto the railing or wall if needed for balance. Keep your knees straight and your torso square. Then, lift your left / right hip up toward the ceiling. Slowly let your left / right hip lower toward the floor, past the starting position. Your foot should get closer to the floor. Do not lean or bend  your knees. Repeat 2 times. Complete this exercise 3 times per week.  Document Released: 07/21/2005 Document Revised: 03/25/2016 Document Reviewed: 06/22/2015 Elsevier Interactive Patient Education  [employer].

## 2021-02-15 NOTE — Progress Notes (Signed)
Musculoskeletal Exam  Patient: Reginald Johnson DOB: [birth year]  DOS: 02/15/2021  SUBJECTIVE:  Chief Complaint:   Chief Complaint  Patient presents with   Back Pain    Right leg pain     Reginald Johnson is a 50 y.o.  male for evaluation and treatment of back pain.   Onset:  1 month ago.  No inj or change in activity.  Location: lower back, radiation to R leg Character:  aching and sharp  Progression of issue:  has worsened Associated symptoms: radiates down R thigh Denies bruising, redness, swelling, bowel/bladder incontinence or weakness Treatment: to date has been muscle relaxers, heat, ice, NSAIDs and tramadol.   Neurovascular symptoms: no  Past Medical History:  Diagnosis Date   Crohn disease (HCC)    Crohn's     Objective:  VITAL SIGNS: BP 138/82   Pulse 64   Temp 98 F (36.7 C) (Oral)   Ht 5\' 9"  (1.753 m)   Wt 190 lb 6 oz (86.4 kg)   SpO2 99%   BMI 28.11 kg/m  Constitutional: Well formed, well developed. No acute distress. HENT: Normocephalic, atraumatic.  Thorax & Lungs:  No accessory muscle use Musculoskeletal: low back.   Tenderness to palpation: yes over greater troch Deformity: no Ecchymosis: no Straight leg test: negative for Poor hamstring flexibility b/l. Neg log roll, Ober's Neurologic: Normal sensory function. No focal deficits noted. DTR's equal and symmetric in LE's. No clonus. Psychiatric: Normal mood. Age appropriate judgment and insight. Alert & oriented x 3.    Procedure note: Greater trochanteric bursa injection Verbal consent obtained. The area of interest was palpated and demarcated with an otoscope speculum. It was cleaned with an alcohol swab. Freeze spray was used. A 25 g needle was inserted at a perpendicular angle through the area of interested. The plunger was withdrawn to ensure our placement was not in a vessel. 2 mL of 1% lidocaine without epi and 40 mg of Depomedrol was injected. A bandaid was placed. The patient tolerated  the procedure well.  There were no complications noted.   Assessment:  Iliotibial band syndrome of right side - Plan: methylPREDNISolone acetate (DEPO-MEDROL) injection 40 mg  Greater trochanteric bursitis of right hip - Plan: methylPREDNISolone acetate (DEPO-MEDROL) injection 40 mg, PR DRAIN/INJECT LARGE JOINT/BURSA  Plan: Stretches/exercises, heat, ice, Tylenol, NSAIDs. PT if no better in 3-4 weeks.  F/u prn. The patient voiced understanding and agreement to the plan.   [place], DO 02/15/21  10:05 AM

## 2021-02-22 ENCOUNTER — Ambulatory Visit: Payer: BC Managed Care – PPO

## 2021-02-25 ENCOUNTER — Other Ambulatory Visit: Payer: Self-pay

## 2021-02-25 MED ORDER — PFIZER-BIONT COVID-19 VAC-TRIS 30 MCG/0.3ML IM SUSP
INTRAMUSCULAR | 0 refills | Status: AC
  Filled 2021-02-25: qty 0.3, 1d supply, fill #0

## 2022-01-10 ENCOUNTER — Ambulatory Visit: Payer: BC Managed Care – PPO | Admitting: Family Medicine

## 2022-01-10 IMAGING — DX DG LUMBAR SPINE COMPLETE 4+V
5 series · 5 of 5 positions shown · non-contrast
Comparison: 06/10/2013 MRI.

CLINICAL DATA: Right-sided back pain with radiation to right leg
for 3 weeks.

EXAM:
LUMBAR SPINE - COMPLETE 4+ VIEW

[l-spine ap]
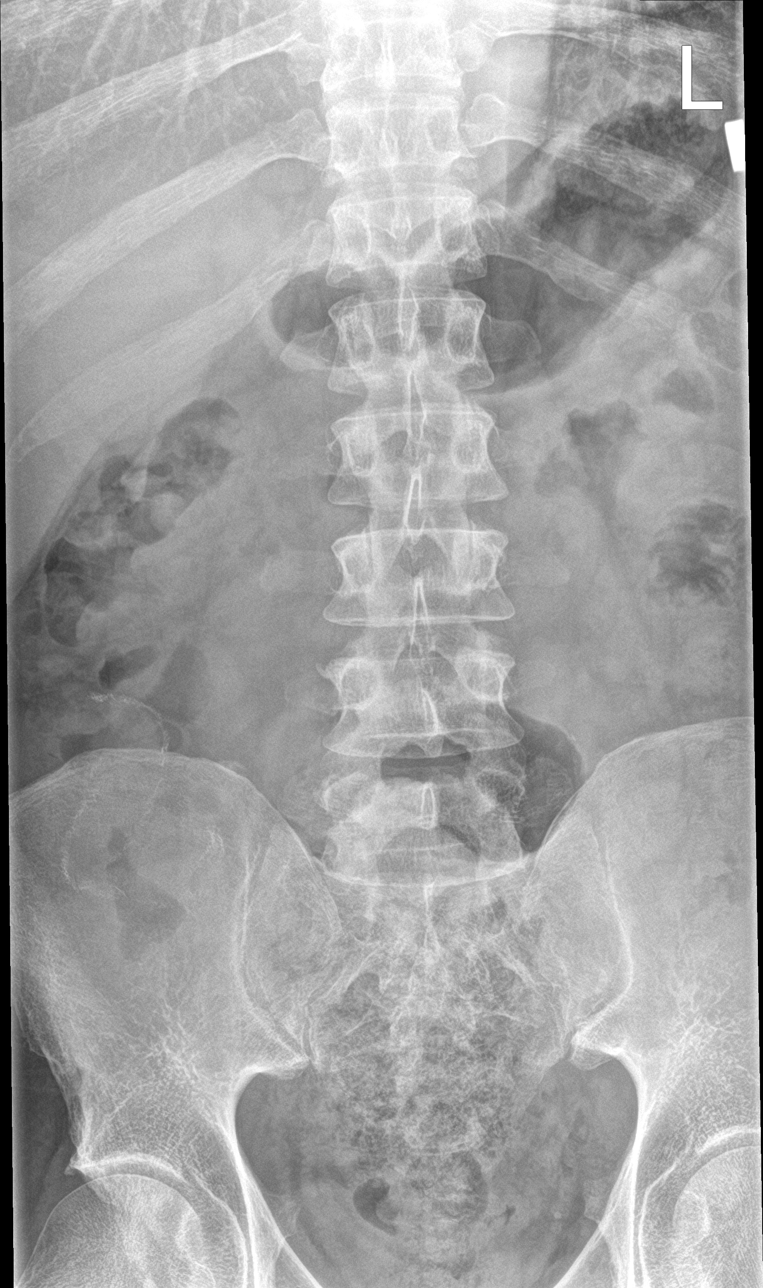

[l-spine obl (1 of 2)]
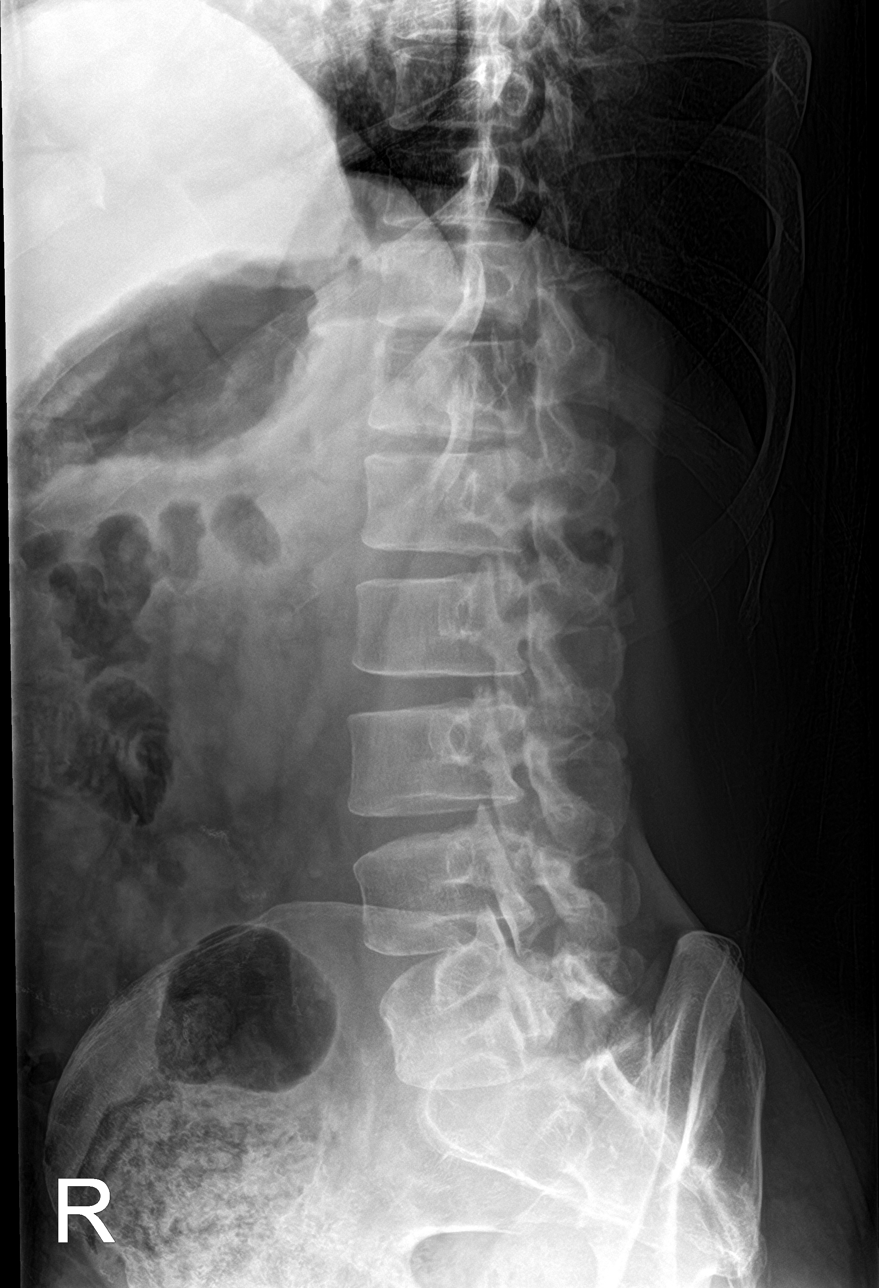

[l-spine obl (2 of 2)]
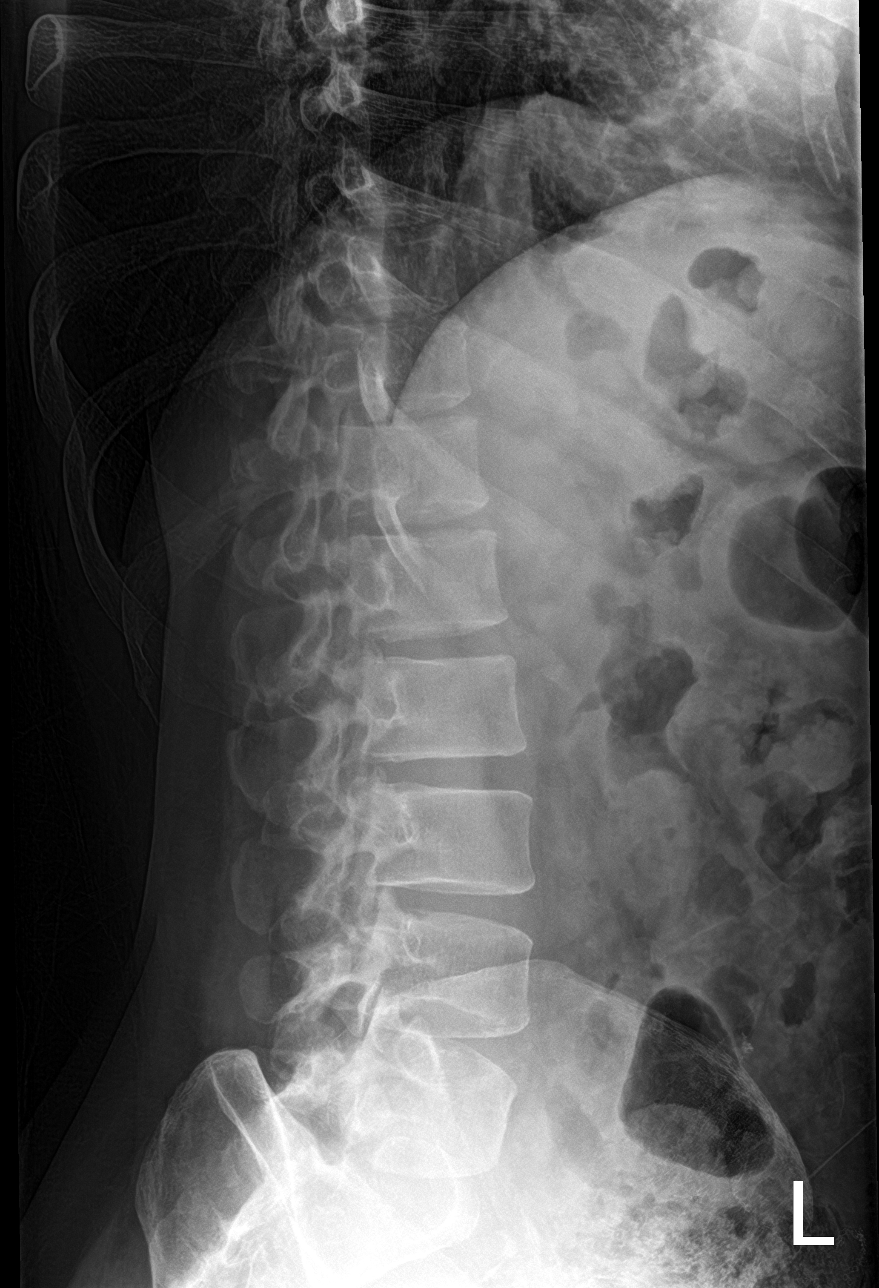

[l-spine lat]
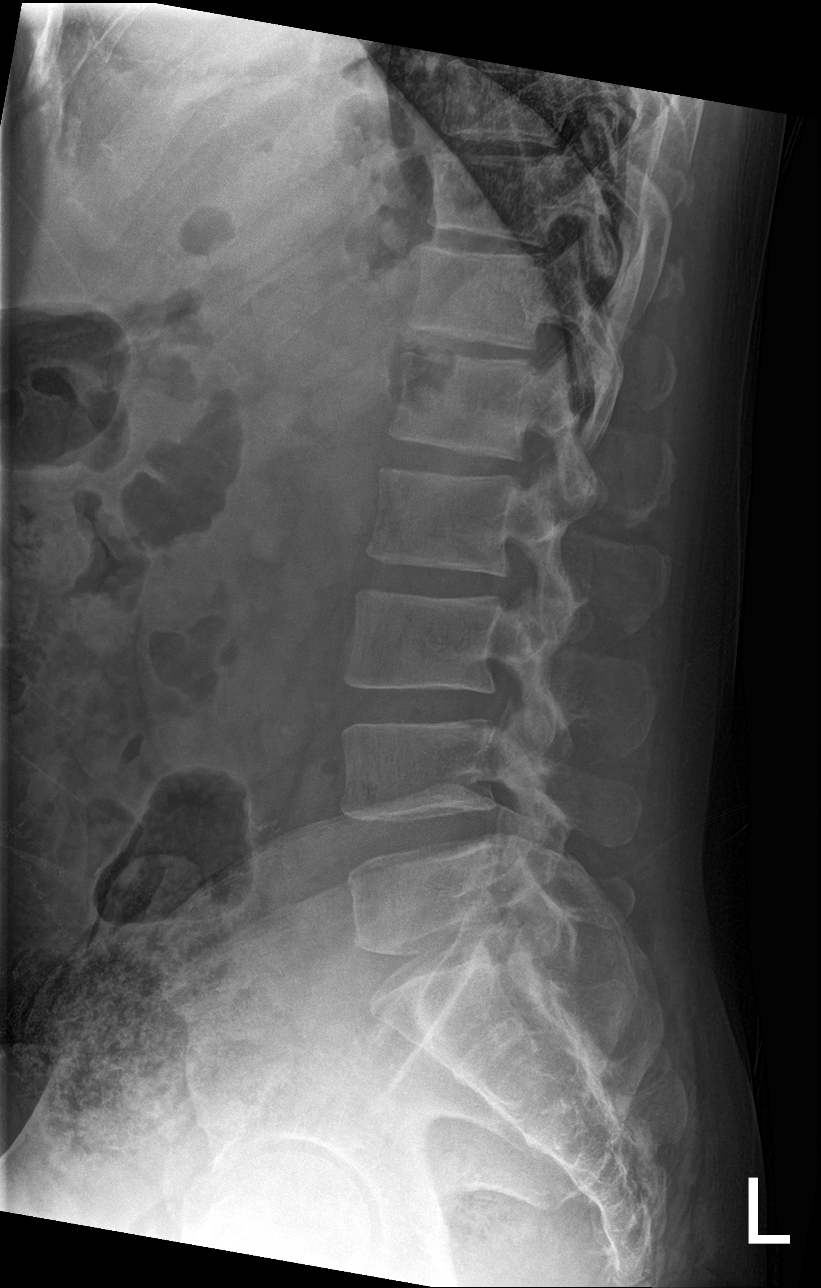

[l-spine spot]
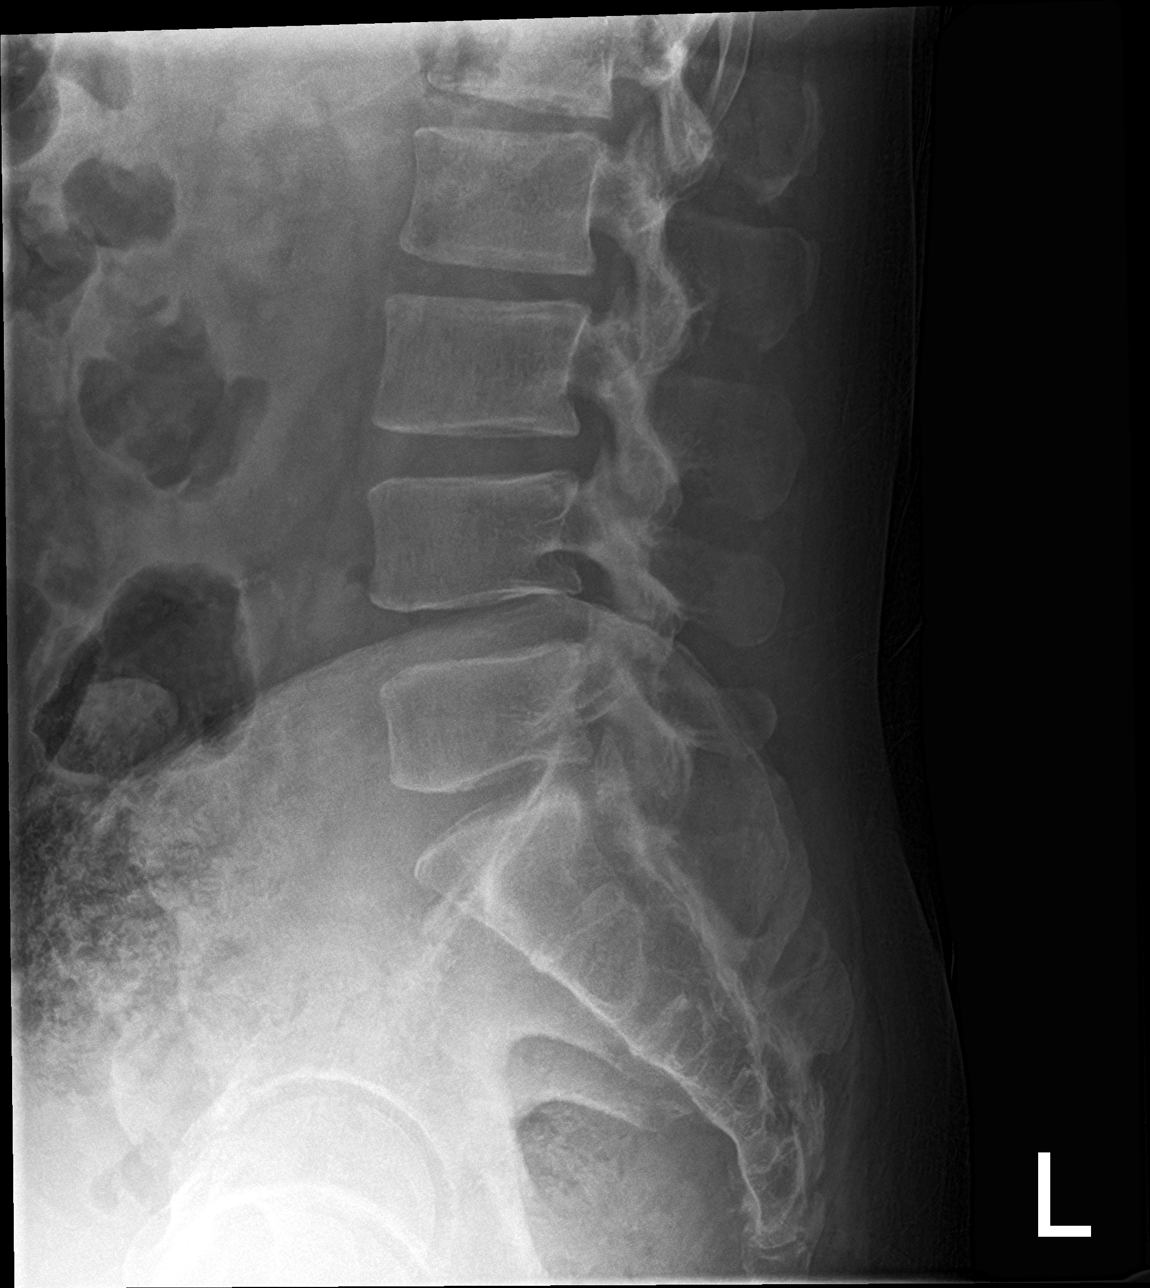

[5 of 5 positions shown; findings below may reference images not displayed]

FINDINGS: Five lumbar type vertebral bodies. Sacroiliac joints are symmetric.
Maintenance of vertebral body height. Trace L5-S1 retrolisthesis.
Intervertebral disc heights are maintained.
IMPRESSION: No acute osseous abnormality.

## 2022-03-31 ENCOUNTER — Other Ambulatory Visit: Payer: Self-pay

## 2022-04-25 ENCOUNTER — Ambulatory Visit: Payer: BC Managed Care – PPO | Admitting: Family Medicine
